# Patient Record
Sex: Female | Born: 1935 | Race: White | Hispanic: No | Smoking: Former smoker
Health system: Southern US, Community
[De-identification: ages and names within clinical notes are randomized; demographics above are authoritative.]

## PROBLEM LIST (undated history)

## (undated) DIAGNOSIS — K209 Esophagitis, unspecified without bleeding: Secondary | ICD-10-CM

## (undated) DIAGNOSIS — I509 Heart failure, unspecified: Secondary | ICD-10-CM

## (undated) DIAGNOSIS — I10 Essential (primary) hypertension: Secondary | ICD-10-CM

## (undated) DIAGNOSIS — S32609A Unspecified fracture of unspecified ischium, initial encounter for closed fracture: Secondary | ICD-10-CM

## (undated) DIAGNOSIS — K222 Esophageal obstruction: Secondary | ICD-10-CM

## (undated) DIAGNOSIS — K449 Diaphragmatic hernia without obstruction or gangrene: Secondary | ICD-10-CM

## (undated) HISTORY — PX: APPENDECTOMY: SHX54

## (undated) HISTORY — DX: Esophagitis, unspecified: K20.9

## (undated) HISTORY — DX: Diaphragmatic hernia without obstruction or gangrene: K44.9

## (undated) HISTORY — DX: Esophagitis, unspecified without bleeding: K20.90

## (undated) HISTORY — DX: Esophageal obstruction: K22.2

---

## 2014-08-27 ENCOUNTER — Emergency Department (HOSPITAL_COMMUNITY): Payer: Medicare Other

## 2014-08-27 ENCOUNTER — Encounter (HOSPITAL_COMMUNITY): Payer: Self-pay | Admitting: Emergency Medicine

## 2014-08-27 ENCOUNTER — Inpatient Hospital Stay (HOSPITAL_COMMUNITY)
Admission: EM | Admit: 2014-08-27 | Discharge: 2014-08-31 | DRG: 871 | Disposition: A | Discharge status: Skilled Nursing Facility | Payer: Medicare Other | Attending: Internal Medicine | Admitting: Internal Medicine

## 2014-08-27 DIAGNOSIS — Z681 Body mass index (BMI) 19 or less, adult: Secondary | ICD-10-CM

## 2014-08-27 DIAGNOSIS — I480 Paroxysmal atrial fibrillation: Secondary | ICD-10-CM | POA: Diagnosis not present

## 2014-08-27 DIAGNOSIS — I2699 Other pulmonary embolism without acute cor pulmonale: Secondary | ICD-10-CM

## 2014-08-27 DIAGNOSIS — R1110 Vomiting, unspecified: Secondary | ICD-10-CM | POA: Diagnosis not present

## 2014-08-27 DIAGNOSIS — L8990 Pressure ulcer of unspecified site, unspecified stage: Secondary | ICD-10-CM | POA: Diagnosis present

## 2014-08-27 DIAGNOSIS — I255 Ischemic cardiomyopathy: Secondary | ICD-10-CM | POA: Diagnosis present

## 2014-08-27 DIAGNOSIS — R570 Cardiogenic shock: Secondary | ICD-10-CM | POA: Insufficient documentation

## 2014-08-27 DIAGNOSIS — I248 Other forms of acute ischemic heart disease: Secondary | ICD-10-CM | POA: Diagnosis not present

## 2014-08-27 DIAGNOSIS — E43 Unspecified severe protein-calorie malnutrition: Secondary | ICD-10-CM | POA: Diagnosis present

## 2014-08-27 DIAGNOSIS — K449 Diaphragmatic hernia without obstruction or gangrene: Secondary | ICD-10-CM | POA: Diagnosis present

## 2014-08-27 DIAGNOSIS — Z87891 Personal history of nicotine dependence: Secondary | ICD-10-CM

## 2014-08-27 DIAGNOSIS — Z515 Encounter for palliative care: Secondary | ICD-10-CM | POA: Diagnosis not present

## 2014-08-27 DIAGNOSIS — I10 Essential (primary) hypertension: Secondary | ICD-10-CM | POA: Diagnosis present

## 2014-08-27 DIAGNOSIS — E2740 Unspecified adrenocortical insufficiency: Secondary | ICD-10-CM | POA: Diagnosis present

## 2014-08-27 DIAGNOSIS — R54 Age-related physical debility: Secondary | ICD-10-CM | POA: Diagnosis present

## 2014-08-27 DIAGNOSIS — S32592D Other specified fracture of left pubis, subsequent encounter for fracture with routine healing: Secondary | ICD-10-CM | POA: Diagnosis not present

## 2014-08-27 DIAGNOSIS — I4891 Unspecified atrial fibrillation: Secondary | ICD-10-CM

## 2014-08-27 DIAGNOSIS — N179 Acute kidney failure, unspecified: Secondary | ICD-10-CM | POA: Diagnosis present

## 2014-08-27 DIAGNOSIS — I5041 Acute combined systolic (congestive) and diastolic (congestive) heart failure: Secondary | ICD-10-CM | POA: Diagnosis not present

## 2014-08-27 DIAGNOSIS — D539 Nutritional anemia, unspecified: Secondary | ICD-10-CM | POA: Diagnosis present

## 2014-08-27 DIAGNOSIS — I429 Cardiomyopathy, unspecified: Secondary | ICD-10-CM

## 2014-08-27 DIAGNOSIS — N281 Cyst of kidney, acquired: Secondary | ICD-10-CM | POA: Diagnosis present

## 2014-08-27 DIAGNOSIS — J9811 Atelectasis: Secondary | ICD-10-CM | POA: Diagnosis present

## 2014-08-27 DIAGNOSIS — R6521 Severe sepsis with septic shock: Secondary | ICD-10-CM | POA: Diagnosis present

## 2014-08-27 DIAGNOSIS — H9190 Unspecified hearing loss, unspecified ear: Secondary | ICD-10-CM | POA: Diagnosis present

## 2014-08-27 DIAGNOSIS — A419 Sepsis, unspecified organism: Principal | ICD-10-CM | POA: Diagnosis not present

## 2014-08-27 DIAGNOSIS — K810 Acute cholecystitis: Secondary | ICD-10-CM | POA: Diagnosis present

## 2014-08-27 DIAGNOSIS — D638 Anemia in other chronic diseases classified elsewhere: Secondary | ICD-10-CM | POA: Diagnosis present

## 2014-08-27 DIAGNOSIS — K829 Disease of gallbladder, unspecified: Secondary | ICD-10-CM

## 2014-08-27 DIAGNOSIS — Z66 Do not resuscitate: Secondary | ICD-10-CM | POA: Diagnosis present

## 2014-08-27 DIAGNOSIS — R579 Shock, unspecified: Secondary | ICD-10-CM | POA: Insufficient documentation

## 2014-08-27 DIAGNOSIS — K819 Cholecystitis, unspecified: Secondary | ICD-10-CM | POA: Diagnosis present

## 2014-08-27 DIAGNOSIS — R1013 Epigastric pain: Secondary | ICD-10-CM

## 2014-08-27 DIAGNOSIS — D72829 Elevated white blood cell count, unspecified: Secondary | ICD-10-CM | POA: Diagnosis present

## 2014-08-27 HISTORY — DX: Essential (primary) hypertension: I10

## 2014-08-27 HISTORY — DX: Unspecified fracture of unspecified ischium, initial encounter for closed fracture: S32.609A

## 2014-08-27 LAB — URINALYSIS, ROUTINE W REFLEX MICROSCOPIC
Bilirubin Urine: NEGATIVE
GLUCOSE, UA: NEGATIVE mg/dL
KETONES UR: NEGATIVE mg/dL
LEUKOCYTES UA: NEGATIVE
Nitrite: NEGATIVE
Protein, ur: >300 mg/dL — AB
Specific Gravity, Urine: 1.015 (ref 1.005–1.030)
Urobilinogen, UA: 0.2 mg/dL (ref 0.0–1.0)
pH: 6 (ref 5.0–8.0)

## 2014-08-27 LAB — CBC WITH DIFFERENTIAL/PLATELET
Basophils Absolute: 0 10*3/uL (ref 0.0–0.1)
Basophils Relative: 0 % (ref 0–1)
EOS PCT: 0 % (ref 0–5)
Eosinophils Absolute: 0 10*3/uL (ref 0.0–0.7)
HEMATOCRIT: 39.3 % (ref 36.0–46.0)
HEMOGLOBIN: 12.6 g/dL (ref 12.0–15.0)
Lymphocytes Relative: 9 % — ABNORMAL LOW (ref 12–46)
Lymphs Abs: 2 10*3/uL (ref 0.7–4.0)
MCH: 31.8 pg (ref 26.0–34.0)
MCHC: 32.1 g/dL (ref 30.0–36.0)
MCV: 99.2 fL (ref 78.0–100.0)
MONO ABS: 2.1 10*3/uL — AB (ref 0.1–1.0)
Monocytes Relative: 9 % (ref 3–12)
Neutro Abs: 19.2 10*3/uL — ABNORMAL HIGH (ref 1.7–7.7)
Neutrophils Relative %: 82 % — ABNORMAL HIGH (ref 43–77)
Platelets: 648 10*3/uL — ABNORMAL HIGH (ref 150–400)
RBC: 3.96 MIL/uL (ref 3.87–5.11)
RDW: 15.5 % (ref 11.5–15.5)
WBC: 23.4 10*3/uL — AB (ref 4.0–10.5)

## 2014-08-27 LAB — COMPREHENSIVE METABOLIC PANEL
ALK PHOS: 138 U/L — AB (ref 39–117)
ALT: 15 U/L (ref 0–35)
AST: 28 U/L (ref 0–37)
Albumin: 2.8 g/dL — ABNORMAL LOW (ref 3.5–5.2)
Anion gap: 12 (ref 5–15)
BILIRUBIN TOTAL: 0.2 mg/dL — AB (ref 0.3–1.2)
BUN: 19 mg/dL (ref 6–23)
CHLORIDE: 104 meq/L (ref 96–112)
CO2: 26 mEq/L (ref 19–32)
CREATININE: 0.72 mg/dL (ref 0.50–1.10)
Calcium: 9.2 mg/dL (ref 8.4–10.5)
GFR calc Af Amer: >90 mL/min (ref >90)
GFR, EST NON AFRICAN AMERICAN: 81 mL/min — AB (ref >90)
Glucose, Bld: 131 mg/dL — ABNORMAL HIGH (ref 70–99)
POTASSIUM: 4.7 meq/L (ref 3.7–5.3)
Sodium: 142 mEq/L (ref 137–147)
Total Protein: 7 g/dL (ref 6.0–8.3)

## 2014-08-27 LAB — LIPASE, BLOOD: LIPASE: 24 U/L (ref 11–59)

## 2014-08-27 LAB — URINE MICROSCOPIC-ADD ON

## 2014-08-27 MED ORDER — CHLORHEXIDINE GLUCONATE 0.12 % MT SOLN
15.0000 mL | Freq: Two times a day (BID) | OROMUCOSAL | Status: DC
  Administered 2014-08-28 – 2014-08-31 (×6): 15 mL via OROMUCOSAL
  Filled 2014-08-27 (×11): qty 15

## 2014-08-27 MED ORDER — SODIUM CHLORIDE 0.9 % IV SOLN
INTRAVENOUS | Status: DC
  Administered 2014-08-27 – 2014-08-29 (×2): via INTRAVENOUS
  Administered 2014-08-29: 500 mL via INTRAVENOUS
  Administered 2014-08-30: 04:00:00 via INTRAVENOUS
  Administered 2014-08-30: 125 mL/h via INTRAVENOUS
  Administered 2014-08-30: 21:00:00 via INTRAVENOUS

## 2014-08-27 MED ORDER — CETYLPYRIDINIUM CHLORIDE 0.05 % MT LIQD
7.0000 mL | Freq: Two times a day (BID) | OROMUCOSAL | Status: DC
  Administered 2014-08-29 – 2014-08-30 (×4): 7 mL via OROMUCOSAL

## 2014-08-27 MED ORDER — MORPHINE SULFATE 2 MG/ML IJ SOLN
1.0000 mg | INTRAMUSCULAR | Status: DC | PRN
  Administered 2014-08-28 – 2014-08-29 (×2): 1 mg via INTRAVENOUS
  Filled 2014-08-27 (×2): qty 1

## 2014-08-27 MED ORDER — SODIUM CHLORIDE 0.9 % IV BOLUS (SEPSIS)
500.0000 mL | Freq: Once | INTRAVENOUS | Status: AC
  Administered 2014-08-27: 500 mL via INTRAVENOUS

## 2014-08-27 MED ORDER — ENOXAPARIN SODIUM 40 MG/0.4ML ~~LOC~~ SOLN
40.0000 mg | SUBCUTANEOUS | Status: DC
  Filled 2014-08-27: qty 0.4

## 2014-08-27 MED ORDER — PIPERACILLIN-TAZOBACTAM 3.375 G IVPB
3.3750 g | Freq: Three times a day (TID) | INTRAVENOUS | Status: DC
  Administered 2014-08-27 – 2014-08-31 (×11): 3.375 g via INTRAVENOUS
  Filled 2014-08-27 (×11): qty 50

## 2014-08-27 MED ORDER — ONDANSETRON HCL 4 MG/2ML IJ SOLN
4.0000 mg | Freq: Four times a day (QID) | INTRAMUSCULAR | Status: DC | PRN
  Administered 2014-08-31: 4 mg via INTRAVENOUS
  Filled 2014-08-27: qty 2

## 2014-08-27 MED ORDER — ONDANSETRON HCL 4 MG PO TABS: 4.0000 mg | ORAL_TABLET | Freq: Four times a day (QID) | ORAL | Status: DC | PRN

## 2014-08-27 MED ORDER — IOHEXOL 300 MG/ML  SOLN
50.0000 mL | Freq: Once | INTRAMUSCULAR | Status: AC | PRN
  Administered 2014-08-27: 50 mL via ORAL

## 2014-08-27 MED ORDER — PIPERACILLIN-TAZOBACTAM 3.375 G IVPB 30 MIN
3.3750 g | Freq: Once | INTRAVENOUS | Status: AC
  Administered 2014-08-27: 3.375 g via INTRAVENOUS
  Filled 2014-08-27: qty 50

## 2014-08-27 MED ORDER — IOHEXOL 300 MG/ML  SOLN
100.0000 mL | Freq: Once | INTRAMUSCULAR | Status: AC | PRN
  Administered 2014-08-27: 100 mL via INTRAVENOUS

## 2014-08-27 MED ORDER — ACETAMINOPHEN 325 MG PO TABS
650.0000 mg | ORAL_TABLET | Freq: Once | ORAL | Status: AC
  Administered 2014-08-27: 650 mg via ORAL
  Filled 2014-08-27: qty 2

## 2014-08-27 MED ORDER — ONDANSETRON HCL 4 MG/2ML IJ SOLN
4.0000 mg | Freq: Once | INTRAMUSCULAR | Status: AC
  Administered 2014-08-27: 4 mg via INTRAVENOUS
  Filled 2014-08-27: qty 2

## 2014-08-27 NOTE — ED Provider Notes (Signed)
CSN: [id number]     Arrival date & time 08/27/14  4132 History   First MD Initiated Contact with Patient 08/27/14 [phone]     Chief Complaint  Patient presents with  . N/V/D      (Consider location/radiation/quality/duration/timing/severity/associated sxs/prior Treatment) HPI Tami Keith is a 79 year old female with past medical history of hypertension who presents to the ER complaining of nausea, vomiting. Patient reports her nausea and vomiting began yesterday right after lunch time. She states she vomited "several times" through the afternoon and then several times in the evening, the last time being approximately 0300 hrs this morning. Patient reports several episodes of diarrhea, which she states were normal for her. Patient reports associated mild, upper abdominal pain. Patient denies fever, dizziness, weakness, blurred vision, syncope, shortness of breath, chest pain, dysuria.  Past Medical History  Diagnosis Date  . Hypertension    History reviewed. No pertinent past surgical history. History reviewed. No pertinent family history. History  Substance Use Topics  . Smoking status: Former Games developer  . Smokeless tobacco: Not on file  . Alcohol Use: No   OB History    No data available     Review of Systems  Constitutional: Negative for fever.  HENT: Negative for trouble swallowing.   Eyes: Negative for visual disturbance.  Respiratory: Negative for shortness of breath.   Cardiovascular: Negative for chest pain.  Gastrointestinal: Positive for nausea, vomiting, abdominal pain and diarrhea.  Genitourinary: Negative for dysuria.  Musculoskeletal: Negative for neck pain.  Skin: Negative for rash.  Neurological: Negative for dizziness, weakness and numbness.  Psychiatric/Behavioral: Negative.       Allergies  Review of patient's allergies indicates no known allergies.  Home Medications   Prior to Admission medications   Medication Sig Start Date End Date Taking?  Authorizing Provider  Amino Acids-Protein Hydrolys (FEEDING SUPPLEMENT, PRO-STAT SUGAR FREE 64,) LIQD Take 30 mLs by mouth 2 (two) times daily.   Yes Historical Provider, MD  citalopram (CELEXA) 20 MG tablet Take 20 mg by mouth daily.   Yes Historical Provider, MD  HYDROcodone-acetaminophen (NORCO/VICODIN) 5-325 MG per tablet Take 1 tablet by mouth every 6 (six) hours as needed for moderate pain.   Yes Historical Provider, MD  lactose free nutrition (BOOST) LIQD Take 237 mLs by mouth 2 (two) times daily between meals.   Yes Historical Provider, MD  loperamide (IMODIUM) 2 MG capsule Take 2 mg by mouth daily as needed for diarrhea or loose stools.   Yes Historical Provider, MD  loratadine (CLARITIN) 10 MG tablet Take 10 mg by mouth daily as needed for allergies.   Yes Historical Provider, MD  metroNIDAZOLE (FLAGYL) 500 MG tablet Take 500 mg by mouth 2 (two) times daily.   Yes Historical Provider, MD  potassium phosphate, monobasic, (K-PHOS ORIGINAL) 500 MG tablet Take 500 mg by mouth 2 (two) times daily with a meal.   Yes Historical Provider, MD  sennosides-docusate sodium (SENOKOT-S) 8.6-50 MG tablet Take 1 tablet by mouth daily.   Yes Historical Provider, MD  sodium bicarbonate 650 MG tablet Take 650 mg by mouth daily.   Yes Historical Provider, MD   BP 117/85 mmHg  Pulse 103  Temp(Src) 97.8 F (36.6 C) (Oral)  Resp 19  SpO2 93% Physical Exam  Constitutional: Vital signs are normal.  Non-toxic appearance.  Frail-appearing elderly female sitting semi-Fowlers in the stretcher, speaking in full, clear sentences in no acute distress.  HENT:  Head: Normocephalic and atraumatic.  Mouth/Throat: Oropharynx is clear and  moist. No oropharyngeal exudate.  Eyes: Right eye exhibits no discharge. Left eye exhibits no discharge. No scleral icterus.  Neck: Normal range of motion.  Cardiovascular: Normal rate, regular rhythm, S1 normal, S2 normal, normal heart sounds and normal pulses.   No murmur  heard. Pulmonary/Chest: Effort normal and breath sounds normal. No accessory muscle usage. No tachypnea. No respiratory distress.  Abdominal: Soft. Normal appearance and bowel sounds are normal. There is tenderness in the epigastric area. There is no rigidity, no guarding, no tenderness at McBurney's point and negative Murphy's sign.  Mild epigastric tenderness.  Musculoskeletal: Normal range of motion. She exhibits no edema or tenderness.  Neurological: She is alert. She has normal strength. No cranial nerve deficit or sensory deficit. Coordination normal. GCS eye subscore is 4. GCS verbal subscore is 5. GCS motor subscore is 6.  Patient fully alert answering questions appropriately in full, clear sentences. Cranial nerves II through XII grossly intact.  Skin: Skin is warm and dry. No rash noted.  Psychiatric: She has a normal mood and affect.  Nursing note and vitals reviewed.   ED Course  Procedures (including critical care time) Labs Review Labs Reviewed  CBC WITH DIFFERENTIAL - Abnormal; Notable for the following:    WBC 23.4 (*)    Platelets 648 (*)    Neutrophils Relative % 82 (*)    Neutro Abs 19.2 (*)    Lymphocytes Relative 9 (*)    Monocytes Absolute 2.1 (*)    All other components within normal limits  URINALYSIS, ROUTINE W REFLEX MICROSCOPIC - Abnormal; Notable for the following:    Hgb urine dipstick SMALL (*)    Protein, ur >300 (*)    All other components within normal limits  COMPREHENSIVE METABOLIC PANEL - Abnormal; Notable for the following:    Glucose, Bld 131 (*)    Albumin 2.8 (*)    Alkaline Phosphatase 138 (*)    Total Bilirubin 0.2 (*)    GFR calc non Af Amer 81 (*)    All other components within normal limits  URINE MICROSCOPIC-ADD ON - Abnormal; Notable for the following:    Squamous Epithelial / LPF FEW (*)    All other components within normal limits  LIPASE, BLOOD    Imaging Review Ct Abdomen Pelvis W Contrast  08/27/2014   CLINICAL DATA:   Nursing Home patient with acute epigastric pain, nausea, vomiting, diarrhea.  EXAM: CT ABDOMEN AND PELVIS WITH CONTRAST  TECHNIQUE: Multidetector CT imaging of the abdomen and pelvis was performed using the standard protocol following bolus administration of intravenous contrast.  CONTRAST:  50mL OMNIPAQUE IOHEXOL 300 MG/ML SOLN, 100mL OMNIPAQUE IOHEXOL 300 MG/ML SOLN  COMPARISON:  08/27/2014 abdominal series  FINDINGS: Lower chest: Minor basilar interstitial fibrosis pattern with atelectasis and trace bilateral pleural effusions. Large hiatal hernia noted. Normal heart size. No pericardial effusion.  Abdomen: Abdominal anatomy is slightly distorted secondary to the degree of chronic levoscoliosis of the spine.  Gallbladder is in the midline of the abdomen with pericholecystic fluid noted and mucosal enhancement. Small amount of free fluid tracks along the right inferior liver margin and the right pericolic gutter. Appearance is compatible with cholecystitis. No associated biliary dilatation. Liver, biliary system, pancreas, spleen, adrenal glands, and left kidney are within normal limits for age and demonstrate no acute process.  Right kidney demonstrates a large upper pole renal cyst measuring 8.2 x 8.6 cm, image 14.  Atherosclerosis noted of the abdominal aorta without aneurysm. Aorta is tortuous again related  to the scoliosis.  Negative for bowel obstruction, dilatation, ileus, or free air.  Appendix not demonstrated.  Pelvis: Trace pelvic free fluid. Prior hysterectomy noted. No acute distal bowel process. Urinary bladder unremarkable. No pelvic fluid collection, hemorrhage, abscess, adenopathy, inguinal abnormality, or hernia.  Healing fractures present of the left superior and inferior rami. Degenerative changes of the hips and spine diffusely.  Imaged proximal right hip and thigh demonstrates subcutaneous emphysema, of uncertain origin. This is partially imaged.  IMPRESSION: Abnormal gallbladder with mucosal  enhancement and surrounding pericholecystic fluid. Free fluid tracks along the posterior right liver margin and right pericolic gutter. Findings are compatible with cholecystitis.  No associated biliary dilatation or obstruction.  Small bilateral pleural effusions with basilar atelectasis/fibrosis  Large hiatal hernia  8.6 cm right upper pole renal cyst  Anterior right hip and proximal thigh subcutaneous air/ emphysema without clear etiology.  Healing subacute left superior and inferior rami fractures.  These results will be called to the ordering clinician or representative by the Radiology Department at the imaging location.   Electronically Signed   By: Ruel Favorsrevor  Shick M.D.   On: 08/27/2014 13:32   Dg Abd Acute W/chest  08/27/2014   CLINICAL DATA:  Nausea, vomiting, and diarrhea for 2 days.  EXAM: ACUTE ABDOMEN SERIES (ABDOMEN 2 VIEW & CHEST 1 VIEW)  COMPARISON:  Two-view chest x-ray 08/03/2014  FINDINGS: A large hiatal hernia is again seen. The heart size is normal. Emphysematous changes are noted in the lungs.  Supine in decubitus imaging of the abdomen demonstrates a relatively gasless abdomen. There is no evidence for obstruction or free air. Levoconvex scoliosis of the lumbar spine is again seen. Atherosclerotic changes are noted in the aorta.  IMPRESSION: 1. No acute abnormality of the chest or abdomen. 2. Gasless abdomen without evidence for obstruction or free air. 3. Atherosclerosis. 4. Large hiatal hernia.   Electronically Signed   By: Gennette Pachris  Mattern M.D.   On: 08/27/2014 07:23     EKG Interpretation None      MDM   Final diagnoses:  Vomiting  Epigastric pain    79 year old female presenting with one day of nausea and vomiting. Patient afebrile, nontachypneic, non-tachycardic, ill appearing, however in no acute distress. Workup for possible gastroenteritis or acute abdominal pain and symptomatic therapy.   Acute abdomen with chest x-ray with impression: 1. No acute abnormality of the  chest or abdomen. 2. Gasless abdomen without evidence for obstruction or free air. 3. Atherosclerosis. 4. Large hiatal hernia.  Pt with leukocytosis of 23.  Patient continues to be tachycardic around 115 on reexam.Will f/u with CT abd pelv with contrast.    CT abdomen pelvis with impression: Abnormal gallbladder with mucosal enhancement and surrounding pericholecystic fluid. Free fluid tracks along the posterior right liver margin and right pericolic gutter. Findings are compatible with cholecystitis.  No associated biliary dilatation or obstruction.  Small bilateral pleural effusions with basilar atelectasis/fibrosis  Large hiatal hernia  8.6 cm right upper pole renal cyst  Anterior right hip and proximal thigh subcutaneous air/ emphysema without clear etiology.  Healing subacute left superior and inferior rami fractures.  Consult placed to surgery. Surgery advises to admit patient to medicine. [facility].  The patient appears reasonably stabilized for admission considering the current resources, flow, and capabilities available in the ED at this time, and I doubt any other [facility] requiring further screening and/or treatment in the ED prior to admission.  BP 117/85 mmHg  Pulse  103  Temp(Src) 97.8 F (36.6 C) (Oral)  Resp 19  SpO2 93%  Signed,  Ladona Mow, PA-C 4:37 PM  Patient seen and discussed with Dr. Benjiman Core, M.D.   Monte Fantasia, PA-C 08/27/14 1637  Shon Baton, MD 08/29/14 [phone]

## 2014-08-27 NOTE — Plan of Care (Signed)
Problem: Phase I Progression Outcomes Goal: Pain controlled with appropriate interventions Outcome: Completed/Met Date Met:  08/27/14 Goal: Hemodynamically stable Outcome: Completed/Met Date Met:  08/27/14

## 2014-08-27 NOTE — ED Notes (Signed)
Pt comes [facility] states she has had n/v/d x2 days. Facility has given pt phenergan 12.5mg  and immodium, 600ml NS bolus with no relief. EMS gave pt 4mg  of Zofran with relief. Pt c/o feeling cold. Pt is alert and oriented.

## 2014-08-27 NOTE — ED Notes (Signed)
Bed: WA06 Expected date:  Expected time:  Means of arrival:  Comments: Ems- 

## 2014-08-27 NOTE — Progress Notes (Signed)
ANTIBIOTIC CONSULT NOTE - INITIAL  Pharmacy Consult for Zosyn Indication: Intra-abdominal infection/Cholecystitis  No Known Allergies  Patient Measurements: Height: 5\' 7"  (170.2 cm) Weight: 80 lb (36.288 kg) IBW/kg (Calculated) : 61.6   Vital Signs: Temp: 97.7 F (36.5 C) (11/14 1710) Temp Source: Oral (11/14 1710) BP: 102/75 mmHg (11/14 1710) Pulse Rate: 100 (11/14 1710) Intake/Output from previous day:   Intake/Output from this shift:    Labs:  Recent Labs  08/27/14 0719 08/27/14 0927  WBC 23.4*  --   HGB 12.6  --   PLT 648*  --   CREATININE  --  0.72   Estimated Creatinine Clearance: 33.7 mL/min (by C-G formula based on Cr of 0.72). No results for input(s): VANCOTROUGH, VANCOPEAK, VANCORANDOM, GENTTROUGH, GENTPEAK, GENTRANDOM, TOBRATROUGH, TOBRAPEAK, TOBRARND, AMIKACINPEAK, AMIKACINTROU, AMIKACIN in the last 72 hours.   Microbiology: No results found for this or any previous visit (from the past 720 hour(s)).  Medical History: Past Medical History  Diagnosis Date  . Hypertension     Assessment: 79 y/o F with PMH of HTN presenting with persistent nausea, vomiting, abdominal pain, and diarrhea for more than a week. CT of abdomen done in ED revealed findings consistent with cholecystitis. Pharmacy has been consulted to assist with Zosyn dosing.  11/14 >> Zosyn  >>  Tmax: 98.8F WBCs: elevated, 23.4 Renal: WNL, SCr 0.72  Goal of Therapy:  Appropriate antibiotic dosing for renal function Eradication of infection   Plan:  1. Zosyn 3.375 grams IV x 1 over 30 minutes (already ordered), then continue with 3.375 grams IV q8h with each dose infused over 4 hours. 2. Follow renal function, cultures, clinical progress.   Greer PickerelJigna Dwayna Kentner, PharmD, BCPS Pager: [phone] 08/27/2014 5:33 PM

## 2014-08-27 NOTE — ED Notes (Addendum)
Pt nauseous with CT contrast. Unable to hold contrast down. Pt states it tastes nasty.

## 2014-08-27 NOTE — ED Notes (Addendum)
Attempt to collect urine sample. Pt brief saturated with urine. Pt assisted with bedpan. Pt unable to provide sample. New brief applied and pericare done.  PA notified.

## 2014-08-27 NOTE — ED Notes (Signed)
Pt IV placed by facility in thigh facing opposite direction. IV removed and new IV placed in right wrist.

## 2014-08-27 NOTE — ED Notes (Signed)
In and Out was performed. No urine return. Nurse was notified.

## 2014-08-27 NOTE — Consult Note (Signed)
Re:   Tami Keith DOB:   05-19-1935 MRN:   [id number]   [facility] Consultation  ASSESSMENT AND PLAN: 1.  Cholecystitis by CT scan - the patient is fairly asymptomatic on exam  Her family is at the bedside.  I discussed with the patient the indications and risks of gall bladder surgery.  The primary risks of gall bladder surgery include, but are not limited to, bleeding, infection, common bile duct injury, and open surgery.   I tried to answer the patient's questions.  I gave the patient a booklet about gall bladder surgery.  Because of her frailty, she is at high risk for post op complications.  I outlined the options of: 1) surgery for gall bladder disease (one daughter has had gall bladder surgery), 2) percutaneous drain, 3) antibiotics and see how she does.  At this time, she does not want surgery.  She does not look sick.  Not sure that the leukocytosis is explained entirely by the gall bladder.  Will repeat CBC in AM and get US of gall bladder to better show gall bladder anatomy.  On Zosyn.  Will follow.  2.  Frail elderly patient with no muscle mass 3.  Recent pelvic fracture - left pubic rami fx on CT scan  Walks only with assistance 4.  HTN 5.  Significant hiatal hernia on CT scan 6.  Right renal cyst  Chief Complaint  Patient presents with  . N/V/D    REFERRING PHYSICIAN:  Jinny SandersJoseph Mintz, PA, [facility]  HISTORY OF PRESENT ILLNESS: Tami GhaziVera Theurer is a 79 y.o. (DOB: 05-19-1935)  white  female whose primary care physician is No primary care provider on file. ([facility]. She has her 3 daughters in the room:  Leslee HomeBonnie Baker, Trula Orehristina Spainhour, and General Motorsachel Dinkins.  She fell at home about one month ago.  She fractured her pelvis and [facility].  She has run some fevers whose etiology is unclear.  Most recently she was on Flagyl, but was having some diarrhea.  She said that she has "hurt  all over", but no localized pain.  She had an appendectomy at age 89 and has had a hysterectomy.  She has no known GI disease and she said that she had a colonoscopy just a couple of years ago.  CT scan of abdomen - 08/27/2014 - 1.  Abnormal gallbladder with mucosal enhancement and surrounding pericholecystic fluid. Free fluid tracks along the posterior right liver margin and right pericolic gutter. Findings are compatible with cholecystitis, 2.  Large hiatal hernia,  3. 8.6 cm right upper pole renal cyst, 4.  Anterior right hip and proximal thigh subcutaneous air/ emphysema without clear etiology.  5)  Healing subacute left superior and inferior rami fractures.  WBC - 23,400 - 08/27/2014  Past Medical History  Diagnosis Date  . Hypertension      History reviewed. No pertinent past surgical history.    Current Facility-Administered Medications  Medication Dose Route Frequency Provider Last Rate Last Dose  . piperacillin-tazobactam (ZOSYN) IVPB 3.375 g  3.375 g Intravenous Once Kathlen ModyVijaya Akula, MD      . Melene Muller[START ON 08/28/2014] piperacillin-tazobactam (ZOSYN) IVPB 3.375 g  3.375 g Intravenous Q8H Kathlen ModyVijaya Akula, MD       Current Outpatient Prescriptions  Medication Sig Dispense Refill  . Amino Acids-Protein Hydrolys (FEEDING SUPPLEMENT, PRO-STAT SUGAR FREE 64,) LIQD Take 30 mLs by mouth  2 (two) times daily.    . citalopram (CELEXA) 20 MG tablet Take 20 mg by mouth daily.    Marland Kitchen. HYDROcodone-acetaminophen (NORCO/VICODIN) 5-325 MG per tablet Take 1 tablet by mouth every 6 (six) hours as needed for moderate pain.    Marland Kitchen. lactose free nutrition (BOOST) LIQD Take 237 mLs by mouth 2 (two) times daily between meals.    Marland Kitchen. loperamide (IMODIUM) 2 MG capsule Take 2 mg by mouth daily as needed for diarrhea or loose stools.    Marland Kitchen. loratadine (CLARITIN) 10 MG tablet Take 10 mg by mouth daily as needed for allergies.    . metroNIDAZOLE (FLAGYL) 500 MG tablet Take 500 mg by mouth 2 (two) times daily.    . potassium  phosphate, monobasic, (K-PHOS ORIGINAL) 500 MG tablet Take 500 mg by mouth 2 (two) times daily with a meal.    . sennosides-docusate sodium (SENOKOT-S) 8.6-50 MG tablet Take 1 tablet by mouth daily.    . sodium bicarbonate 650 MG tablet Take 650 mg by mouth daily.       No Known Allergies  REVIEW OF SYSTEMS: Skin:  No history of rash.   Infection:  No history of MRSA. Neurologic:  No history of stroke.  No history of seizure.  No history of headaches. Cardiac:  Hypertension for about 2 years.  Though recently, it sounds like she has held her meds because her BP has been too low. Pulmonary:  Quit smoking many years ago.  No asthma or bronchitis.  No OSA/CPAP.  Endocrine:  No diabetes. No thyroid disease. Gastrointestinal:  See HPI.  No stomach, liver, or colon history. Urologic:  No history of kidney stones.  No history of bladder infections. Musculoskeletal:  In [facility] because of broken pelvis.  No surgery. Hematologic:  No bleeding disorder.  No history of anemia.  Not anticoagulated. Psycho-social:  The patient is oriented.   The patient has no obvious psychologic or social impairment to understanding our conversation and plan.  SOCIAL and FAMILY HISTORY: Widowed in 2004. She has her 3 daughters in the room:  Leslee HomeBonnie Baker, Trula Orehristina Spainhour, and General Motorsachel Dinkins. [facility] for about a month.  It does not appear that she can go back to independent living.  PHYSICAL EXAM: BP 121/90 mmHg  Pulse 103  Temp(Src) 97.9 F (36.6 C) (Oral)  Resp 25  SpO2 93%  General: Thin, cachectic WF who is alert.  HEENT: Normal. Pupils equal. Neck: Supple. No mass.  No thyroid mass. Lymph Nodes:  No supraclavicular or cervical nodes. Lungs: Clear to auscultation and symmetric breath sounds. Heart:  RRR. No murmur or rub. Abdomen: Soft. No mass.  No hernia. Normal bowel sounds.  She has no localized tenderness and I cannot feel the gall bladder.  She is  tiny. Rectal: Not done. Extremities:  I can feel crepitus over her left upper/lateral thigh. Neurologic:  Grossly intact to motor and sensory function. Psychiatric: Behavior is normal.   DATA REVIEWED: Epic notes.  Ovidio Kinavid Sacha Topor, MD,  [facility] [address]  [address]   [place], [place]    [id number] Phone:  [phone] FAX:  [phone]

## 2014-08-27 NOTE — H&P (Signed)
[facility] History and Physical  Akaysha Cobern ZOX:[id number] DOB: [birth year] DOA: 08/27/2014  Referring physician: EDP PCP: No primary care provider on file.   Chief Complaint: nausea, vomiting and diarrhea for more than a week.   HPI: Tami Keith is a 79 y.o. female  With prior h/o hypertension, coming from [facility], presents with persistent nausea, vomiting and abdominal pain and diarrhea for more than week. On arrival to ED, CT abdomen revealed findings consistent with cholecystitis. She denies fever, but reports chills and generalized weakness. Labs revealed elevated wbc count and thrombocytosis. She is referred to medical service for admission and surgery Dr Ezzard Standing consulted.    Review of Systems:  Constitutional:  Chills present.  HEENT:  No headaches, Difficulty swallowing,Tooth/dental problems,Sore throat,  No sneezing, itching, ear ache, nasal congestion, post nasal drip,  Cardio-vascular:  No chest pain, Orthopnea, PND, swelling in lower extremities, anasarca, dizziness, palpitations  GI:  Nausea, vomiting and diarrhea for more than a week.  Resp:  No shortness of breath with exertion or at rest. No excess mucus, no productive cough, No non-productive cough, No coughing up of blood.No change in color of mucus.No wheezing.No chest wall deformity  Skin:  no rash or lesions.  GU:  no dysuria, change in color of urine, no urgency or frequency. No flank pain.  Musculoskeletal:  No joint pain or swelling. No decreased range of motion. No back pain.    Past Medical History  Diagnosis Date  . Hypertension    History reviewed. No pertinent past surgical history. Social History:  reports that she has quit smoking. She does not have any smokeless tobacco history on file. She reports that she does not drink alcohol or use illicit drugs.  No Known Allergies  History reviewed. No pertinent family history known.  Prior to Admission medications   Medication Sig Start  Date End Date Taking? Authorizing Provider  Amino Acids-Protein Hydrolys (FEEDING SUPPLEMENT, PRO-STAT SUGAR FREE 64,) LIQD Take 30 mLs by mouth 2 (two) times daily.   Yes Historical Provider, MD  citalopram (CELEXA) 20 MG tablet Take 20 mg by mouth daily.   Yes Historical Provider, MD  HYDROcodone-acetaminophen (NORCO/VICODIN) 5-325 MG per tablet Take 1 tablet by mouth every 6 (six) hours as needed for moderate pain.   Yes Historical Provider, MD  lactose free nutrition (BOOST) LIQD Take 237 mLs by mouth 2 (two) times daily between meals.   Yes Historical Provider, MD  loperamide (IMODIUM) 2 MG capsule Take 2 mg by mouth daily as needed for diarrhea or loose stools.   Yes Historical Provider, MD  loratadine (CLARITIN) 10 MG tablet Take 10 mg by mouth daily as needed for allergies.   Yes Historical Provider, MD  metroNIDAZOLE (FLAGYL) 500 MG tablet Take 500 mg by mouth 2 (two) times daily.   Yes Historical Provider, MD  potassium phosphate, monobasic, (K-PHOS ORIGINAL) 500 MG tablet Take 500 mg by mouth 2 (two) times daily with a meal.   Yes Historical Provider, MD  sennosides-docusate sodium (SENOKOT-S) 8.6-50 MG tablet Take 1 tablet by mouth daily.   Yes Historical Provider, MD  sodium bicarbonate 650 MG tablet Take 650 mg by mouth daily.   Yes Historical Provider, MD   Physical Exam: Filed Vitals:   08/27/14 1146 08/27/14 1230 08/27/14 1300 08/27/14 1536  BP: 142/95 138/91 141/99 117/85  Pulse: 102 102 111 103  Temp: 97.8 F (36.6 C)     TempSrc: Oral     Resp: 20 13 17  19  SpO2: 95% 97% 95% 93%    Wt Readings from Last 3 Encounters:  No data found for Wt    General:  Appears calm and comfortable Eyes: PERRL, normal lids, irises & conjunctiva Neck: no LAD, masses or thyromegaly Cardiovascular: RRR, no m/r/g. No LE edema. Respiratory: CTA bilaterally, no w/r/r. Normal respiratory effort. Abdomen: soft, mild generalized tenderness, bowel sounds heard.  Skin: stage 1 decubitus over  the sacrum.  Musculoskeletal: grossly normal tone BUE/BLE Neurologic: alert, oriented to place and person, speech normal and able to move all extremities. .          Labs on Admission:  Basic Metabolic Panel:  Recent Labs Lab 08/27/14 0927  NA 142  K 4.7  CL 104  CO2 26  GLUCOSE 131*  BUN 19  CREATININE 0.72  CALCIUM 9.2   Liver Function Tests:  Recent Labs Lab 08/27/14 0927  AST 28  ALT 15  ALKPHOS 138*  BILITOT 0.2*  PROT 7.0  ALBUMIN 2.8*    Recent Labs Lab 08/27/14 0927  LIPASE 24   No results for input(s): AMMONIA in the last 168 hours. CBC:  Recent Labs Lab 08/27/14 0719  WBC 23.4*  NEUTROABS 19.2*  HGB 12.6  HCT 39.3  MCV 99.2  PLT 648*   Cardiac Enzymes: No results for input(s): CKTOTAL, CKMB, CKMBINDEX, TROPONINI in the last 168 hours.  BNP (last 3 results) No results for input(s): PROBNP in the last 8760 hours. CBG: No results for input(s): GLUCAP in the last 168 hours.  Radiological Exams on Admission: Ct Abdomen Pelvis W Contrast  08/27/2014   CLINICAL DATA:  Nursing Home patient with acute epigastric pain, nausea, vomiting, diarrhea.  EXAM: CT ABDOMEN AND PELVIS WITH CONTRAST  TECHNIQUE: Multidetector CT imaging of the abdomen and pelvis was performed using the standard protocol following bolus administration of intravenous contrast.  CONTRAST:  50mL OMNIPAQUE IOHEXOL 300 MG/ML SOLN, 100mL OMNIPAQUE IOHEXOL 300 MG/ML SOLN  COMPARISON:  08/27/2014 abdominal series  FINDINGS: Lower chest: Minor basilar interstitial fibrosis pattern with atelectasis and trace bilateral pleural effusions. Large hiatal hernia noted. Normal heart size. No pericardial effusion.  Abdomen: Abdominal anatomy is slightly distorted secondary to the degree of chronic levoscoliosis of the spine.  Gallbladder is in the midline of the abdomen with pericholecystic fluid noted and mucosal enhancement. Small amount of free fluid tracks along the right inferior liver margin and  the right pericolic gutter. Appearance is compatible with cholecystitis. No associated biliary dilatation. Liver, biliary system, pancreas, spleen, adrenal glands, and left kidney are within normal limits for age and demonstrate no acute process.  Right kidney demonstrates a large upper pole renal cyst measuring 8.2 x 8.6 cm, image 14.  Atherosclerosis noted of the abdominal aorta without aneurysm. Aorta is tortuous again related to the scoliosis.  Negative for bowel obstruction, dilatation, ileus, or free air.  Appendix not demonstrated.  Pelvis: Trace pelvic free fluid. Prior hysterectomy noted. No acute distal bowel process. Urinary bladder unremarkable. No pelvic fluid collection, hemorrhage, abscess, adenopathy, inguinal abnormality, or hernia.  Healing fractures present of the left superior and inferior rami. Degenerative changes of the hips and spine diffusely.  Imaged proximal right hip and thigh demonstrates subcutaneous emphysema, of uncertain origin. This is partially imaged.  IMPRESSION: Abnormal gallbladder with mucosal enhancement and surrounding pericholecystic fluid. Free fluid tracks along the posterior right liver margin and right pericolic gutter. Findings are compatible with cholecystitis.  No associated biliary dilatation or obstruction.  Small bilateral pleural effusions  with basilar atelectasis/fibrosis  Large hiatal hernia  8.6 cm right upper pole renal cyst  Anterior right hip and proximal thigh subcutaneous air/ emphysema without clear etiology.  Healing subacute left superior and inferior rami fractures.  These results will be called to the ordering clinician or representative by the Radiology Department at the imaging location.   Electronically Signed   By: Ruel Favorsrevor  Shick M.D.   On: 08/27/2014 13:32   Dg Abd Acute W/chest  08/27/2014   CLINICAL DATA:  Nausea, vomiting, and diarrhea for 2 days.  EXAM: ACUTE ABDOMEN SERIES (ABDOMEN 2 VIEW & CHEST 1 VIEW)  COMPARISON:  Two-view chest  x-ray 08/03/2014  FINDINGS: A large hiatal hernia is again seen. The heart size is normal. Emphysematous changes are noted in the lungs.  Supine in decubitus imaging of the abdomen demonstrates a relatively gasless abdomen. There is no evidence for obstruction or free air. Levoconvex scoliosis of the lumbar spine is again seen. Atherosclerotic changes are noted in the aorta.  IMPRESSION: 1. No acute abnormality of the chest or abdomen. 2. Gasless abdomen without evidence for obstruction or free air. 3. Atherosclerosis. 4. Large hiatal hernia.   Electronically Signed   By: Gennette Pachris  Mattern M.D.   On: 08/27/2014 07:23    EKG: pending.   Assessment/Plan Active Problems:   Cholecystitis   Acute cholecystitis  Nausea, vomiting Diarrhea and abdominal pain probably from Acute cholecystitis: Admitted to med surg and started her on IV antibiotics and IV fluids.  Surgery consulted for recommendations.  IV anti emetics and NPO .    Leukocytosis probably from the cholecystitis.    DVT prophylaxis.     Code Status: DNR DVT Prophylaxis: Family Communication: discussed with family at bedside Disposition Plan: admit to med surg.   Time spent: 55 min.  [facility] [place] LLCKULA,Thelmer Legler [facility] Pager [phone]

## 2014-08-28 ENCOUNTER — Inpatient Hospital Stay (HOSPITAL_COMMUNITY): Payer: Medicare Other

## 2014-08-28 DIAGNOSIS — R6521 Severe sepsis with septic shock: Secondary | ICD-10-CM

## 2014-08-28 DIAGNOSIS — I4891 Unspecified atrial fibrillation: Secondary | ICD-10-CM | POA: Diagnosis not present

## 2014-08-28 DIAGNOSIS — A419 Sepsis, unspecified organism: Secondary | ICD-10-CM | POA: Diagnosis not present

## 2014-08-28 LAB — PHOSPHORUS: Phosphorus: 2.9 mg/dL (ref 2.3–4.6)

## 2014-08-28 LAB — BASIC METABOLIC PANEL
ANION GAP: 12 (ref 5–15)
BUN: 24 mg/dL — ABNORMAL HIGH (ref 6–23)
CALCIUM: 8.4 mg/dL (ref 8.4–10.5)
CO2: 23 mEq/L (ref 19–32)
Chloride: 106 mEq/L (ref 96–112)
Creatinine, Ser: 1.29 mg/dL — ABNORMAL HIGH (ref 0.50–1.10)
GFR, EST AFRICAN AMERICAN: 45 mL/min — AB (ref >90)
GFR, EST NON AFRICAN AMERICAN: 39 mL/min — AB (ref >90)
Glucose, Bld: 98 mg/dL (ref 70–99)
Potassium: 4.5 mEq/L (ref 3.7–5.3)
SODIUM: 141 meq/L (ref 137–147)

## 2014-08-28 LAB — CBC WITH DIFFERENTIAL/PLATELET
Basophils Absolute: 0 10*3/uL (ref 0.0–0.1)
Basophils Relative: 0 % (ref 0–1)
Eosinophils Absolute: 0 10*3/uL (ref 0.0–0.7)
Eosinophils Relative: 0 % (ref 0–5)
HCT: 33.7 % — ABNORMAL LOW (ref 36.0–46.0)
HEMOGLOBIN: 10.5 g/dL — AB (ref 12.0–15.0)
LYMPHS PCT: 12 % (ref 12–46)
Lymphs Abs: 2.4 10*3/uL (ref 0.7–4.0)
MCH: 31.3 pg (ref 26.0–34.0)
MCHC: 31.2 g/dL (ref 30.0–36.0)
MCV: 100.6 fL — ABNORMAL HIGH (ref 78.0–100.0)
MONOS PCT: 13 % — AB (ref 3–12)
Monocytes Absolute: 2.6 10*3/uL — ABNORMAL HIGH (ref 0.1–1.0)
NEUTROS ABS: 15.3 10*3/uL — AB (ref 1.7–7.7)
NEUTROS PCT: 75 % (ref 43–77)
Platelets: 508 10*3/uL — ABNORMAL HIGH (ref 150–400)
RBC: 3.35 MIL/uL — ABNORMAL LOW (ref 3.87–5.11)
RDW: 15.8 % — ABNORMAL HIGH (ref 11.5–15.5)
WBC: 20.3 10*3/uL — AB (ref 4.0–10.5)

## 2014-08-28 LAB — MRSA PCR SCREENING: MRSA BY PCR: NEGATIVE

## 2014-08-28 LAB — PROTIME-INR
INR: 1.68 — AB (ref 0.00–1.49)
Prothrombin Time: 19.9 seconds — ABNORMAL HIGH (ref 11.6–15.2)

## 2014-08-28 LAB — LACTIC ACID, PLASMA: Lactic Acid, Venous: 2 mmol/L (ref 0.5–2.2)

## 2014-08-28 LAB — APTT: aPTT: 32 seconds (ref 24–37)

## 2014-08-28 LAB — CLOSTRIDIUM DIFFICILE BY PCR: CDIFFPCR: NEGATIVE

## 2014-08-28 LAB — MAGNESIUM: MAGNESIUM: 1.8 mg/dL (ref 1.5–2.5)

## 2014-08-28 LAB — D-DIMER, QUANTITATIVE: D-Dimer, Quant: 1.57 ug/mL-FEU — ABNORMAL HIGH (ref 0.00–0.48)

## 2014-08-28 MED ORDER — SODIUM CHLORIDE 0.9 % IV BOLUS (SEPSIS)
500.0000 mL | Freq: Once | INTRAVENOUS | Status: AC
  Administered 2014-08-28: 500 mL via INTRAVENOUS

## 2014-08-28 MED ORDER — HEPARIN BOLUS VIA INFUSION
900.0000 [IU] | Freq: Once | INTRAVENOUS | Status: AC
  Administered 2014-08-28: 900 [IU] via INTRAVENOUS
  Filled 2014-08-28: qty 900

## 2014-08-28 MED ORDER — METRONIDAZOLE IN NACL 5-0.79 MG/ML-% IV SOLN
500.0000 mg | Freq: Once | INTRAVENOUS | Status: AC
  Administered 2014-08-28: 500 mg via INTRAVENOUS
  Filled 2014-08-28: qty 100

## 2014-08-28 MED ORDER — AMIODARONE LOAD VIA INFUSION
150.0000 mg | Freq: Once | INTRAVENOUS | Status: AC
  Administered 2014-08-28: 150 mg via INTRAVENOUS
  Filled 2014-08-28: qty 83.34

## 2014-08-28 MED ORDER — SODIUM CHLORIDE 0.9 % IV BOLUS (SEPSIS): 1000.0000 mL | Freq: Once | INTRAVENOUS | Status: DC

## 2014-08-28 MED ORDER — PHENYLEPHRINE HCL 10 MG/ML IJ SOLN
0.0000 ug/min | INTRAVENOUS | Status: DC
  Administered 2014-08-28: 10 ug/min via INTRAVENOUS
  Administered 2014-08-28: 50 ug/min via INTRAVENOUS
  Administered 2014-08-29: 75 ug/min via INTRAVENOUS
  Filled 2014-08-28 (×3): qty 1

## 2014-08-28 MED ORDER — HEPARIN (PORCINE) IN NACL 100-0.45 UNIT/ML-% IJ SOLN
500.0000 [IU]/h | INTRAMUSCULAR | Status: DC
  Administered 2014-08-28: 500 [IU]/h via INTRAVENOUS
  Filled 2014-08-28: qty 250

## 2014-08-28 MED ORDER — SODIUM CHLORIDE 0.9 % IV BOLUS (SEPSIS)
1000.0000 mL | Freq: Once | INTRAVENOUS | Status: AC
  Administered 2014-08-28: 1000 mL via INTRAVENOUS

## 2014-08-28 MED ORDER — AMIODARONE HCL IN DEXTROSE 360-4.14 MG/200ML-% IV SOLN
60.0000 mg/h | INTRAVENOUS | Status: AC
  Administered 2014-08-28 (×2): 60 mg/h via INTRAVENOUS
  Filled 2014-08-28 (×2): qty 200

## 2014-08-28 MED ORDER — AMIODARONE HCL IN DEXTROSE 360-4.14 MG/200ML-% IV SOLN
30.0000 mg/h | INTRAVENOUS | Status: DC
  Administered 2014-08-28: 30 mg/h via INTRAVENOUS
  Filled 2014-08-28: qty 200

## 2014-08-28 NOTE — Progress Notes (Signed)
Upon arrival to unit, patient in A.Fib, runs of vtach and heart rate fluctuating between 120's-180's. BP 69/47 MAP 53. Patient alert and oriented x3; cannot tell me where we were. Answers questions appropriately. MD Blake DivineAkula made aware of rate and and BP; advised to give another 1000 cc bolus, and perform EKG. MD Blake DivineAkula also made aware will come to see patient and consult CCM. Will continue to monitor. Self and Consulting civil engineerCharge RN at bedside.

## 2014-08-28 NOTE — Progress Notes (Signed)
Transferred to ICU Stepdown via bed to room 1246. Accompanied by RN, NT and family.

## 2014-08-28 NOTE — Progress Notes (Signed)
Dr. Blake DivineAkula aware via phone pt BP 82/63 with HR 100, Resp 20 and O2 sats 94% on room air. Afebrile @97 .6. MD aware family called RN to room around 1300 and reported "pt had  starting jerking side to side suddenly lasting a minute or so". Pt calm upon arrival to room. Pt denied pain except for "a little aching in lt knee"-no medication needed. Denied visual disturbances. Grips moderate and lower leg strength moderate against resistance. Radial pulses and dorsal pedis pulses +2. See new orders in EPIC for fluid bolus. Dr. Blake DivineAkula to see pt soon. Family reassured verbally.

## 2014-08-28 NOTE — Progress Notes (Signed)
Dr. Blake DivineAkula aware via phone of pt's recent BP's. Pt has been very sleepy since earlier episode witnessed by family. Able to arouse yet not for extended time. Family reassured. Dr. Blake DivineAkula to round. See new orders entered into EPIC by MD.

## 2014-08-28 NOTE — Progress Notes (Signed)
ANTICOAGULATION CONSULT NOTE - Initial Consult  Pharmacy Consult for Heparin Indication: atrial fibrillation  No Known Allergies  Patient Measurements: Height: 5\' 7"  (170.2 cm) Weight: 79 lb 8 oz (36.061 kg) IBW/kg (Calculated) : 61.6 Heparin Dosing Weight: 36 kg  Vital Signs: Temp: 98.2 F (36.8 C) (11/15 1445) Temp Source: Oral (11/15 1445) BP: 99/70 mmHg (11/15 1700) Pulse Rate: 36 (11/15 1700)  Labs:  Recent Labs  08/27/14 0719 08/27/14 0927 08/28/14 0606 08/28/14 1643  HGB 12.6  --  10.5*  --   HCT 39.3  --  33.7*  --   PLT 648*  --  508*  --   CREATININE  --  0.72  --  1.29*    Estimated Creatinine Clearance: 20.8 mL/min (by C-G formula based on Cr of 1.29).   Medical History: Past Medical History  Diagnosis Date  . Hypertension   . Fracture of pelvis, ischium     Medications:  Scheduled:  . antiseptic oral rinse  7 mL Mouth Rinse q12n4p  . chlorhexidine  15 mL Mouth Rinse BID  . metronidazole  500 mg Intravenous Once  . piperacillin-tazobactam (ZOSYN)  IV  3.375 g Intravenous Q8H   Infusions:  . sodium chloride 125 mL/hr at 08/28/14 1730  . amiodarone 60 mg/hr (08/28/14 1723)   Followed by  . amiodarone    . phenylephrine (NEO-SYNEPHRINE) Adult infusion 30 mcg/min (08/28/14 1745)   PRN: morphine injection, ondansetron **OR** ondansetron (ZOFRAN) IV  Assessment: 79 yo female admitted with sepsis probably secondary to acute cholecystitis vs gastroenteritis, deemed to be high risk for surgery. Developed hypotension requiring fluids and vasopressors, transferred to stepdown unit. Then developed tachycardia, EKG shows atrial fibrillation. Pharmacy consulted to dose IV heparin.   Baseline pTT, PT/INR pending  Hgb slightly decreased from admit, 10.5. Pltc high  SCr acutely rising to 1.29, CrCl 20 ml/min  No bleeding reported   Goal of Therapy:  Heparin level 0.3-0.7 units/ml Monitor platelets by anticoagulation protocol: Yes   Plan:    Heparin 900 units IV bolus  Heparin 500 units/hr IV infusion  Check heparin level in 8hrs  Daily heparin level, CBC  Loralee PacasErin Jaydin Boniface, PharmD, BCPS Pager: [phone] 08/28/2014,6:18 PM

## 2014-08-28 NOTE — Progress Notes (Addendum)
[facility] PROGRESS NOTE  Tami GhaziVera Madera ZOX:[id number] DOB: 12-24-1934 DOA: 08/27/2014 PCP: Olivia Mackieho, William, MD    INTERIM SUMMARY: Tami Keith is a 79 y.o. female With prior h/o hypertension, coming from [facility], presents with persistent nausea, vomiting and abdominal pain and diarrhea for more than week. On arrival to ED, CT abdomen revealed findings consistent with cholecystitis. She denies fever, but reports chills and generalized weakness. Labs revealed elevated wbc count and thrombocytosis. She is referred to medical service for admission and surgery Dr Ezzard StandingNewman consulted. She was deemed high risk for surgery and was managed with IV antibiotics. Earlier today patient was found to have a BP of 80/60's , she was given two boluses of NS of 500ml and she remained hypotensive. She was transferred to stepdown for vasopressors. Her HR running between 140- to 180's in the last one hour. 12 lead EKG shows questionable atrial fibrillation.    Assessment/Plan: 1. Sepsis probably secondary to acute cholecystitis vs gastroenteritis: Her abdomen is benign, and she does not endorse any pain, nausea, vomiting . She had one watery bowel movement this afternoon. C diff PCR is pending. PCCM Consulted for hypotension. She was transferred to stepdown and started on neo by PCCM.  Continue with IV fluids and IV zosyn.  Family at bedside updated of the patient's condition.   Code Status: DNR Family Communication: Discussed with multiple family members at bedside Disposition Plan: pending further investigation.    Consultants:  Surgery  PCCM.  Procedures:  CT abdomen and pelvis  [place] ABD  Antibiotics: IV zosyn  11/14  HPI/Subjective: She is sleepy, denies any nausea, vomiting or abdominal pain.  Objective: Filed Vitals:   08/28/14 1600  BP: 69/47  Pulse: 145  Temp:   Resp:     Intake/Output Summary (Last 24 hours) at 08/28/14 1658 Last data filed at 08/28/14 1400  Gross per 24  hour  Intake 1082.5 ml  Output      0 ml  Net 1082.5 ml   Filed Weights   08/27/14 1710 08/27/14 2107  Weight: 36.288 kg (80 lb) 36.061 kg (79 lb 8 oz)    Exam:   General:  Alert afebrile lethargic  Cardiovascular: s1s2, tachycardic  Respiratory: chest clear to auscultation, no wheezing or rhonchi  Abdomen: soft non tender non distended bowel sounds heard  Musculoskeletal: no pedal edema  Neuro: is alert and oriented to place and person.   Data Reviewed: Basic Metabolic Panel:  Recent Labs Lab 08/27/14 0927  NA 142  K 4.7  CL 104  CO2 26  GLUCOSE 131*  BUN 19  CREATININE 0.72  CALCIUM 9.2   Liver Function Tests:  Recent Labs Lab 08/27/14 0927  AST 28  ALT 15  ALKPHOS 138*  BILITOT 0.2*  PROT 7.0  ALBUMIN 2.8*    Recent Labs Lab 08/27/14 0927  LIPASE 24   No results for input(s): AMMONIA in the last 168 hours. CBC:  Recent Labs Lab 08/27/14 0719 08/28/14 0606  WBC 23.4* 20.3*  NEUTROABS 19.2* 15.3*  HGB 12.6 10.5*  HCT 39.3 33.7*  MCV 99.2 100.6*  PLT 648* 508*   Cardiac Enzymes: No results for input(s): CKTOTAL, CKMB, CKMBINDEX, TROPONINI in the last 168 hours. BNP (last 3 results) No results for input(s): PROBNP in the last 8760 hours. CBG: No results for input(s): GLUCAP in the last 168 hours.  No results found for this or any previous visit (from the past 240 hour(s)).   Studies: [place] Abdomen Complete  08/28/2014  CLINICAL DATA:  79 year old female with abnormal gallbladder on recent CT.  EXAM: ULTRASOUND ABDOMEN COMPLETE  COMPARISON:  08/27/2014 CT  FINDINGS: Gallbladder: No cholelithiasis identified. Mild circumferential gallbladder wall thickening is noted measuring up to 4 mm. There is no evidence of sonographic Murphy sign or pericholecystic fluid.  Common bile duct: Diameter: 4.0 mm. There is no evidence of intrahepatic or extrahepatic biliary dilatation.  Liver: No focal lesion identified. Within normal limits in parenchymal  echogenicity.  IVC: No abnormality visualized.  Pancreas: Visualized portion unremarkable.  Spleen: Size and appearance within normal limits.  Right Kidney: Length: 9.9 cm. Severe chronic hydronephrosis is identified with severe atrophy of the right kidney. No solid mass noted.  Left Kidney: Length: 10 cm. Echogenicity within normal limits. No mass or hydronephrosis visualized.  Abdominal aorta: No aneurysm identified but the distal abdominal aorta is not well visualized secondary to overlying bowel gas.  Other findings: None.  IMPRESSION: Mild circumferential gallbladder wall thickening without cholelithiasis or other signs of acute cholecystitis. This most likely represents wall thickening from hepatic dysfunction or fluid overload/abnormality, rather than acute cholecystitis, but consider nuclear medicine study as clinically indicated.  Severe chronic right hydronephrosis with atrophy of the right kidney.   Electronically Signed   By: Laveda Abbe M.D.   On: 08/28/2014 13:58   Ct Abdomen Pelvis W Contrast  08/27/2014   CLINICAL DATA:  Nursing Home patient with acute epigastric pain, nausea, vomiting, diarrhea.  EXAM: CT ABDOMEN AND PELVIS WITH CONTRAST  TECHNIQUE: Multidetector CT imaging of the abdomen and pelvis was performed using the standard protocol following bolus administration of intravenous contrast.  CONTRAST:  50mL OMNIPAQUE IOHEXOL 300 MG/ML SOLN, OMNIPAQUE IOHEXOL 300 MG/ML SOLN  COMPARISON:  08/27/2014 abdominal series  FINDINGS: Lower chest: Minor basilar interstitial fibrosis pattern with atelectasis and trace bilateral pleural effusions. Large hiatal hernia noted. Normal heart size. No pericardial effusion.  Abdomen: Abdominal anatomy is slightly distorted secondary to the degree of chronic levoscoliosis of the spine.  Gallbladder is in the midline of the abdomen with pericholecystic fluid noted and mucosal enhancement. Small amount of free fluid tracks along the right inferior liver  margin and the right pericolic gutter. Appearance is compatible with cholecystitis. No associated biliary dilatation. Liver, biliary system, pancreas, spleen, adrenal glands, and left kidney are within normal limits for age and demonstrate no acute process.  Right kidney demonstrates a large upper pole renal cyst measuring 8.2 x 8.6 cm, image 14.  Atherosclerosis noted of the abdominal aorta without aneurysm. Aorta is tortuous again related to the scoliosis.  Negative for bowel obstruction, dilatation, ileus, or free air.  Appendix not demonstrated.  Pelvis: Trace pelvic free fluid. Prior hysterectomy noted. No acute distal bowel process. Urinary bladder unremarkable. No pelvic fluid collection, hemorrhage, abscess, adenopathy, inguinal abnormality, or hernia.  Healing fractures present of the left superior and inferior rami. Degenerative changes of the hips and spine diffusely.  Imaged proximal right hip and thigh demonstrates subcutaneous emphysema, of uncertain origin. This is partially imaged.  IMPRESSION: Abnormal gallbladder with mucosal enhancement and surrounding pericholecystic fluid. Free fluid tracks along the posterior right liver margin and right pericolic gutter. Findings are compatible with cholecystitis.  No associated biliary dilatation or obstruction.  Small bilateral pleural effusions with basilar atelectasis/fibrosis  Large hiatal hernia  8.6 cm right upper pole renal cyst  Anterior right hip and proximal thigh subcutaneous air/ emphysema without clear etiology.  Healing subacute left superior and inferior rami fractures.  These results  will be called to the ordering clinician or representative by the Radiology Department at the imaging location.   Electronically Signed   By: Ruel Favorsrevor  Shick M.D.   On: 08/27/2014 13:32   Dg Abd Acute W/chest  08/27/2014   CLINICAL DATA:  Nausea, vomiting, and diarrhea for 2 days.  EXAM: ACUTE ABDOMEN SERIES (ABDOMEN 2 VIEW & CHEST 1 VIEW)  COMPARISON:  Two-view  chest x-ray 08/03/2014  FINDINGS: A large hiatal hernia is again seen. The heart size is normal. Emphysematous changes are noted in the lungs.  Supine in decubitus imaging of the abdomen demonstrates a relatively gasless abdomen. There is no evidence for obstruction or free air. Levoconvex scoliosis of the lumbar spine is again seen. Atherosclerotic changes are noted in the aorta.  IMPRESSION: 1. No acute abnormality of the chest or abdomen. 2. Gasless abdomen without evidence for obstruction or free air. 3. Atherosclerosis. 4. Large hiatal hernia.   Electronically Signed   By: Gennette Pachris  Mattern M.D.   On: 08/27/2014 07:23    Scheduled Meds: . amiodarone  150 mg Intravenous Once  . antiseptic oral rinse  7 mL Mouth Rinse q12n4p  . chlorhexidine  15 mL Mouth Rinse BID  . enoxaparin (LOVENOX) injection  40 mg Subcutaneous Q24H  . metronidazole  500 mg Intravenous Once  . piperacillin-tazobactam (ZOSYN)  IV  3.375 g Intravenous Q8H   Continuous Infusions: . sodium chloride 75 mL/hr at 08/27/14 1750  . amiodarone     Followed by  . amiodarone    . phenylephrine (NEO-SYNEPHRINE) Adult infusion 20 mcg/min (08/28/14 1658)    Active Problems:   Cholecystitis   Acute cholecystitis   Essential hypertension   Leukocytosis    Time spent: 45 minutes.     [facility] HospitalKULA,Gram Siedlecki  [facility] Pager [phone]. If 7PM-7AM, please contact night-coverage at [email or web address], password [email or web address] 08/28/2014, 4:58 PM  LOS: 1 day      Addendum: BMP resulted and shows elevated BUN and creatinine when compared yesterday. Getting IV fluid boluses and IV NS. Probably from hypoperfusion from hypotension. EKG shows RBBB, and right heart strain, will get a D dimer and an echocardiogram evaluate further.    Kathlen ModyVijaya Kateri Balch, MD [phone]

## 2014-08-28 NOTE — Clinical Social Work Psychosocial (Signed)
Clinical Social Work Department BRIEF PSYCHOSOCIAL ASSESSMENT 08/28/2014  Patient:  Tami Keith, Tami Keith     Account Number:  [id number]     [place] date:  08/27/2014  Clinical Social Worker:  Dede Query, CLINICAL SOCIAL WORKER  Date/Time:  08/28/2014 03:55 PM  Referred by:  Physician  Date Referred:  08/28/2014 Referred for  SNF Placement   Other Referral:   Interview type:  Patient Other interview type:   daughter Tami Keith    PSYCHOSOCIAL DATA Living Status:  FACILITY Admitted from facility:  [place] [place] Level of care:  [place] Primary support name:  Loss adjuster, chartered Primary support relationship to patient:  CHILD, ADULT Degree of support available:   high    CURRENT CONCERNS  Other Concerns:    SOCIAL WORK ASSESSMENT / PLAN CSW met with pt and her family at bedside.  Pt's daughter discussed that pt had cracked her pelvis and was doing rehab at [facility] for about 3 weeks when she began throwing up.  Pt's daughter discussed that pt is having probems with her gallbladder.  Pt stated she was unsure if she wanted to go back to [facility] for rehab at discharge  Pt's daughter stated that she and her mother (pt) would discuss placment options and let CSW know if they wanted another placement.  Pt's dog was visiting at bedside. Pt also had about 5 other family members present at bedside   Assessment/plan status:   Other assessment/ plan:   Information/referral to community resources:    PATIENT'S/FAMILY'S RESPONSE TO PLAN OF CARE: Pt very pleasant with CSW. Pt stated that she was not sure she wanted to go back to [facility]. Pt's daughter stated that pt did not like her roomate at [facility] but she would be going to a different room if she went back becasue she did not ask [facility] to hold the room for pt.  Pt and daughter were going to discuss placment options. CSW will follow up with SNF placment closer to discharge   .Dede Query, LCSW [facility] Clinical Social Worker - Weekend Coverage cell #: [phone]

## 2014-08-28 NOTE — Progress Notes (Signed)
Dr. Blake DivineAkula recently in to see pt and family. Report called to Marcelino DusterMichelle, RN in ICU-stepdown regarding pt status and reason for transfer.

## 2014-08-28 NOTE — Consult Note (Signed)
PULMONARY / CRITICAL CARE MEDICINE   Name: Tami Keith MRN: [id number] DOB: Nov 16, 1934    ADMISSION DATE:  08/27/2014 CONSULTATION DATE:  08/28/2014  REFERRING MD :  Blake Divine TRH  CHIEF COMPLAINT:  Septic shock  INITIAL PRESENTATION: 79 y/o female admitted from SNF on 11/14 nausea, vomiting and diarrhea, found to have acute cholecystitis. Pulmonary critical care medicine was consulted on 08/28/2014 for septic shock and A. Fib with RVR. It should be noted that the patient's CODE STATUS is DO NOT RESUSCITATE.  STUDIES:  08/27/2014 CT abdomen pelvis findings are consistent with acute cholecystitis, small bilateral pleural effusions, large hiatal hernia, 8.6 cm right renal cyst, healing subacute left superior and inferior rami fractures, anterior right hip and proximal thigh subcutaneous air without clear etiology 08/28/2014 right upper quadrant ultrasound> No evidence of acute cholecystitis but there is gallbladder thickening, no stones, no CBD dilatation, severe chronic right hydronephrosis  SIGNIFICANT EVENTS: November 14 admitted to hospital  November 15 transferred to  ICU for A. Fib with RVR and shock   HISTORY OF PRESENT ILLNESS:  Tami Keith is a very pleasant 79 year old female with a past medical history significant for hypertension who was admitted on 08/27/2014 for treatment of abdominal pain and diarrhea. She stated that for 1 week prior to admission she had been suffering from nausea vomiting and diarrhea. On the day of admission she was noted to have abdominal pain and a CT scan was performed. This showed evidence of acute cholecystitis. She is admitted to [facility] and treated with antibiotics after a surgery consultation. The family was offered surgery versus percutaneous drainage and elected to go with antibiotics alone because of the patient's frail health. Overnight from November 14 through November 15 she had improvement in her symptoms and she was able to eat some  on November 15. However rather abruptly in the afternoon she developed the sudden onset of shortness of breath and confusion. She noted pain in her left leg at that time. She was found to be in A. Fib with RVR and had hypotension so she was moved to the ICU and pulmonary critical care medicine was consulted. She currently denies chest pain or shortness of breath and is resting comfortably. Of note, the patient had recently been residing in a nursing facility because she fell and had a pelvic fracture. She was undergoing rehabilitation there. She had been immobilized for a fairly long period time without anticoagulation.  PAST MEDICAL HISTORY :   has a past medical history of Hypertension and Fracture of pelvis, ischium.  has past surgical history that includes Appendectomy. Prior to Admission medications   Medication Sig Start Date End Date Taking? Authorizing Provider  Amino Acids-Protein Hydrolys (FEEDING SUPPLEMENT, PRO-STAT SUGAR FREE 64,) LIQD Take 30 mLs by mouth 2 (two) times daily.   Yes Historical Provider, MD  citalopram (CELEXA) 20 MG tablet Take 20 mg by mouth daily.   Yes Historical Provider, MD  HYDROcodone-acetaminophen (NORCO/VICODIN) 5-325 MG per tablet Take 1 tablet by mouth every 6 (six) hours as needed for moderate pain.   Yes Historical Provider, MD  lactose free nutrition (BOOST) LIQD Take 237 mLs by mouth 2 (two) times daily between meals.   Yes Historical Provider, MD  loperamide (IMODIUM) 2 MG capsule Take 2 mg by mouth daily as needed for diarrhea or loose stools.   Yes Historical Provider, MD  loratadine (CLARITIN) 10 MG tablet Take 10 mg by mouth daily as needed for allergies.   Yes Historical Provider,  MD  metroNIDAZOLE (FLAGYL) 500 MG tablet Take 500 mg by mouth 2 (two) times daily.   Yes Historical Provider, MD  potassium phosphate, monobasic, (K-PHOS ORIGINAL) 500 MG tablet Take 500 mg by mouth 2 (two) times daily with a meal.   Yes Historical Provider, MD   sennosides-docusate sodium (SENOKOT-S) 8.6-50 MG tablet Take 1 tablet by mouth daily.   Yes Historical Provider, MD  sodium bicarbonate 650 MG tablet Take 650 mg by mouth daily.   Yes Historical Provider, MD   No Known Allergies  FAMILY HISTORY:  has no family status information on file.  SOCIAL HISTORY:  reports that she has quit smoking. She does not have any smokeless tobacco history on file. She reports that she does not drink alcohol or use illicit drugs.  REVIEW OF SYSTEMS: Gen: Denies fever, chills, weight change, notes fatigue, night sweats HEENT: Denies blurred vision, double vision, hearing loss, tinnitus, sinus congestion, rhinorrhea, sore throat, neck stiffness, dysphagia PULM: Denies shortness of breath, cough, sputum production, hemoptysis, wheezing CV: Denies chest pain, edema, orthopnea, paroxysmal nocturnal dyspnea, palpitations GI: per history of present illness GU: Denies dysuria, hematuria, polyuria, oliguria, urethral discharge Endocrine: Denies hot or cold intolerance, polyuria, polyphagia or appetite change Derm: Denies rash, dry skin, scaling or peeling skin change Heme: Denies easy bruising, bleeding, bleeding gums Neuro: Denies headache, numbness, weakness, slurred speech, loss of memory or consciousness   SUBJECTIVE:   VITAL SIGNS: Temp:  [97.6 F (36.4 C)-98.5 F (36.9 C)] 98.2 F (36.8 C) (11/15 1445) Pulse Rate:  [36-145] 36 (11/15 1700) Resp:  [18-27] 27 (11/15 1700) BP: (69-116)/(46-82) 99/70 mmHg (11/15 1700) SpO2:  [85 %-100 %] 85 % (11/15 1700) Weight:  [36.061 kg (79 lb 8 oz)] 36.061 kg (79 lb 8 oz) (11/14 2107) HEMODYNAMICS:   VENTILATOR SETTINGS:   INTAKE / OUTPUT:  Intake/Output Summary (Last 24 hours) at 08/28/14 1801 Last data filed at 08/28/14 1400  Gross per 24 hour  Intake 1682.5 ml  Output      0 ml  Net 1682.5 ml    PHYSICAL EXAMINATION: General:  Resting comfortably in bed Neuro:  Awake and alert, interactive,  following commands, movesall 4 extremities HEENT:  NCAT, EOMI Cardiovascular:  Irregularly irregular, JVD noted Lungs:  Crackles in bases bilaterally Abdomen:  Bowel sounds are positive no tenderness on my exam, no masses, negative Murphy sign Musculoskeletal:  Decreased muscle bulk and tone Skin:  No skin breakdown  LABS:  CBC  Recent Labs Lab 08/27/14 0719 08/28/14 0606  WBC 23.4* 20.3*  HGB 12.6 10.5*  HCT 39.3 33.7*  PLT 648* 508*   Coag's No results for input(s): APTT, INR in the last 168 hours. BMET  Recent Labs Lab 08/27/14 0927 08/28/14 1643  NA 142 141  K 4.7 4.5  CL 104 106  CO2 26 23  BUN 19 24*  CREATININE 0.72 1.29*  GLUCOSE 131* 98   Electrolytes  Recent Labs Lab 08/27/14 0927 08/28/14 1643  CALCIUM 9.2 8.4  MG  --  1.8  PHOS  --  2.9   Sepsis Markers  Recent Labs Lab 08/28/14 1643  LATICACIDVEN 2.0   ABG No results for input(s): PHART, PCO2ART, PO2ART in the last 168 hours. Liver Enzymes  Recent Labs Lab 08/27/14 0927  AST 28  ALT 15  ALKPHOS 138*  BILITOT 0.2*  ALBUMIN 2.8*   Cardiac Enzymes No results for input(s): TROPONINI, PROBNP in the last 168 hours. Glucose No results for input(s): GLUCAP in the  last 168 hours.  Imaging Ct Abdomen Pelvis W Contrast  08/27/2014   CLINICAL DATA:  Nursing Home patient with acute epigastric pain, nausea, vomiting, diarrhea.  EXAM: CT ABDOMEN AND PELVIS WITH CONTRAST  TECHNIQUE: Multidetector CT imaging of the abdomen and pelvis was performed using the standard protocol following bolus administration of intravenous contrast.  CONTRAST:  50mL OMNIPAQUE IOHEXOL 300 MG/ML SOLN, OMNIPAQUE IOHEXOL 300 MG/ML SOLN  COMPARISON:  08/27/2014 abdominal series  FINDINGS: Lower chest: Minor basilar interstitial fibrosis pattern with atelectasis and trace bilateral pleural effusions. Large hiatal hernia noted. Normal heart size. No pericardial effusion.  Abdomen: Abdominal anatomy is slightly  distorted secondary to the degree of chronic levoscoliosis of the spine.  Gallbladder is in the midline of the abdomen with pericholecystic fluid noted and mucosal enhancement. Small amount of free fluid tracks along the right inferior liver margin and the right pericolic gutter. Appearance is compatible with cholecystitis. No associated biliary dilatation. Liver, biliary system, pancreas, spleen, adrenal glands, and left kidney are within normal limits for age and demonstrate no acute process.  Right kidney demonstrates a large upper pole renal cyst measuring 8.2 x 8.6 cm, image 14.  Atherosclerosis noted of the abdominal aorta without aneurysm. Aorta is tortuous again related to the scoliosis.  Negative for bowel obstruction, dilatation, ileus, or free air.  Appendix not demonstrated.  Pelvis: Trace pelvic free fluid. Prior hysterectomy noted. No acute distal bowel process. Urinary bladder unremarkable. No pelvic fluid collection, hemorrhage, abscess, adenopathy, inguinal abnormality, or hernia.  Healing fractures present of the left superior and inferior rami. Degenerative changes of the hips and spine diffusely.  Imaged proximal right hip and thigh demonstrates subcutaneous emphysema, of uncertain origin. This is partially imaged.  IMPRESSION: Abnormal gallbladder with mucosal enhancement and surrounding pericholecystic fluid. Free fluid tracks along the posterior right liver margin and right pericolic gutter. Findings are compatible with cholecystitis.  No associated biliary dilatation or obstruction.  Small bilateral pleural effusions with basilar atelectasis/fibrosis  Large hiatal hernia  8.6 cm right upper pole renal cyst  Anterior right hip and proximal thigh subcutaneous air/ emphysema without clear etiology.  Healing subacute left superior and inferior rami fractures.  These results will be called to the ordering clinician or representative by the Radiology Department at the imaging location.    Electronically Signed   By: Ruel Favors M.D.   On: 08/27/2014 13:32   Dg Abd Acute W/chest  08/27/2014   CLINICAL DATA:  Nausea, vomiting, and diarrhea for 2 days.  EXAM: ACUTE ABDOMEN SERIES (ABDOMEN 2 VIEW & CHEST 1 VIEW)  COMPARISON:  Two-view chest x-ray 08/03/2014  FINDINGS: A large hiatal hernia is again seen. The heart size is normal. Emphysematous changes are noted in the lungs.  Supine in decubitus imaging of the abdomen demonstrates a relatively gasless abdomen. There is no evidence for obstruction or free air. Levoconvex scoliosis of the lumbar spine is again seen. Atherosclerotic changes are noted in the aorta.  IMPRESSION: 1. No acute abnormality of the chest or abdomen. 2. Gasless abdomen without evidence for obstruction or free air. 3. Atherosclerosis. 4. Large hiatal hernia.   Electronically Signed   By: Gennette Pac M.D.   On: 08/27/2014 07:23     ASSESSMENT / PLAN:  PULMONARY OETT not applicable A: No acute issues P:   -O2 as needed for O2 saturation greater than 92% -Senna spirometry, flutter valve  CARDIOVASCULAR CVLnot applicable A: Shock> Presumably septic, but differential diagnosis includes pulmonary embolism considering  the RV strain pattern seen on EKG and the new onset A. Fib.  I doubt that the hemodynamic abnormality is solely due to A. Fib because her rate has improved and when I was present she was in sinus rhythm but still hypotensive. P:  -heparin drip for A. Fib -Check d-dimer, if elevated would consider lower extremity Doppler ultrasound and possibly CT angioma chest if the serum creatinine improves -Continue amiodarone drip -Continue phenylephrine as needed for mean arterial pressure greater than 65 -We will not place a central line nor will use more than 1 vasopressor as per family's and goals of care -echo tomorrow  RENAL A:  Mild acute kidney injury due to IV contrast versus prerenal from shock P:   -would hold further IV fluids considering  possible volume overload -Use pressors as needed to keep him AP greater than 65 -Renal dose meds  GASTROINTESTINAL A:  Question acute cholecystitis (acalculus?) note no stones seen on right upper quadrant ultrasound  P:   -per surgery -Nothing by mouth  HEMATOLOGIC A:  No acute issues P:  -see plan for heparin and d-dimer above  INFECTIOUS A:  Possible cholecystitis P:   Antibiotics per primary team and surgery   FAMILY  - Updates: I spoke with the patient's daughter and granddaughter at length at bedside. They're very reasonable and they do not want aggressive measures. I think that using just Neo-Synephrine alone overnight is reasonable. Her CODE STATUS is DO NOT RESUSCITATE and if she has not made dramatic improvement by tomorrow then the patient's family would like to consider withdrawing care.   TODAY'S SUMMARY: Eilene GhaziVera Stjulien has shock but I am not entirely clear that this is due to sepsis because her course had otherwise improved significantly with antibiotics overnight. I question a pulmonary embolism.  For now add heparin, check d-dimer, use Neo-Synephrine but no other pressors, amiodarone for A. Fib. If no improvement by tomorrow morning and family would like to consider stopping aggressive measures.  Critical care time by me is 45 minutes  Heber CarolinaBrent Eiley Mcginnity, MD [facility] Pager: [phone] Cell: [phone] If no response, call [phone]   08/28/2014, 6:01 PM

## 2014-08-28 NOTE — Plan of Care (Signed)
Problem: Phase I Progression Outcomes Goal: Voiding-avoid urinary catheter unless indicated Outcome: Completed/Met Date Met:  08/28/14

## 2014-08-28 NOTE — Progress Notes (Signed)
General Surgery Note  LOS: 1 day  POD -     Assessment/Plan: 1. Cholecystitis by CT scan - the patient is fairly asymptomatic on exam I have outlined the options of: 1) surgery for gall bladder disease (one daughter has had gall bladder surgery), 2) percutaneous drain, 3) antibiotics and see how she does.  For now, she wants antibiotics and see how she does. On Zosyn.  WBC - 20,300 - 08/28/2014  Abdominal US pending.  Feels better today.  Having no pain or symptoms  2. Frail elderly patient with no muscle mass 3. Recent pelvic fracture - left pubic rami fx on CT scan Walks only with assistance 4. HTN 5. Significant hiatal hernia on CT scan 6. Right renal cyst 7.  DVT prophylaxis - Lovenox 8.  On isolation while stool being checked.  Her daughters say that she has loose stools on a regular basis.   Active Problems:   Cholecystitis   Acute cholecystitis   Essential hypertension   Leukocytosis   Subjective:  Feels better.  No pain.  2 daughters in room. Objective:   Filed Vitals:   08/28/14 0558  BP: 89/67  Pulse: 99  Temp: 98.5 F (36.9 C)  Resp: 18     Intake/Output from previous day:  11/14 0701 - 11/15 0700 In: 962.5 [I.V.:912.5; IV Piggyback:50] Out: -   Intake/Output this shift:      Physical Exam:   General: Thin elderly WF who is alert and oriented.    HEENT: Normal. Pupils equal. .   Lungs: Clear.   Abdomen: Soft.  Few BS.   No localized tenderness or mass.    Lab Results:    Recent Labs  08/27/14 0719 08/28/14 0606  WBC 23.4* 20.3*  HGB 12.6 10.5*  HCT 39.3 33.7*  PLT 648* 508*    BMET   Recent Labs  08/27/14 0927  NA 142  K 4.7  CL 104  CO2 26  GLUCOSE 131*  BUN 19  CREATININE 0.72  CALCIUM 9.2    PT/INR  No results for input(s): LABPROT, INR in the last 72 hours.  ABG  No results for input(s): PHART, HCO3 in the last 72 hours.  Invalid input(s): PCO2, PO2   Studies/Results:  Ct  Abdomen Pelvis W Contrast  08/27/2014   CLINICAL DATA:  Nursing Home patient with acute epigastric pain, nausea, vomiting, diarrhea.  EXAM: CT ABDOMEN AND PELVIS WITH CONTRAST  TECHNIQUE: Multidetector CT imaging of the abdomen and pelvis was performed using the standard protocol following bolus administration of intravenous contrast.  CONTRAST:  50mL OMNIPAQUE IOHEXOL 300 MG/ML SOLN, 100mL OMNIPAQUE IOHEXOL 300 MG/ML SOLN  COMPARISON:  08/27/2014 abdominal series  FINDINGS: Lower chest: Minor basilar interstitial fibrosis pattern with atelectasis and trace bilateral pleural effusions. Large hiatal hernia noted. Normal heart size. No pericardial effusion.  Abdomen: Abdominal anatomy is slightly distorted secondary to the degree of chronic levoscoliosis of the spine.  Gallbladder is in the midline of the abdomen with pericholecystic fluid noted and mucosal enhancement. Small amount of free fluid tracks along the right inferior liver margin and the right pericolic gutter. Appearance is compatible with cholecystitis. No associated biliary dilatation. Liver, biliary system, pancreas, spleen, adrenal glands, and left kidney are within normal limits for age and demonstrate no acute process.  Right kidney demonstrates a large upper pole renal cyst measuring 8.2 x 8.6 cm, image 14.  Atherosclerosis noted of the abdominal aorta without aneurysm. Aorta is tortuous again related to the scoliosis.  Negative for bowel obstruction, dilatation, ileus, or free air.  Appendix not demonstrated.  Pelvis: Trace pelvic free fluid. Prior hysterectomy noted. No acute distal bowel process. Urinary bladder unremarkable. No pelvic fluid collection, hemorrhage, abscess, adenopathy, inguinal abnormality, or hernia.  Healing fractures present of the left superior and inferior rami. Degenerative changes of the hips and spine diffusely.  Imaged proximal right hip and thigh demonstrates subcutaneous emphysema, of uncertain origin. This is  partially imaged.  IMPRESSION: Abnormal gallbladder with mucosal enhancement and surrounding pericholecystic fluid. Free fluid tracks along the posterior right liver margin and right pericolic gutter. Findings are compatible with cholecystitis.  No associated biliary dilatation or obstruction.  Small bilateral pleural effusions with basilar atelectasis/fibrosis  Large hiatal hernia  8.6 cm right upper pole renal cyst  Anterior right hip and proximal thigh subcutaneous air/ emphysema without clear etiology.  Healing subacute left superior and inferior rami fractures.  These results will be called to the ordering clinician or representative by the Radiology Department at the imaging location.   Electronically Signed   By: Ruel Favorsrevor  Shick M.D.   On: 08/27/2014 13:32   Dg Abd Acute W/chest  08/27/2014   CLINICAL DATA:  Nausea, vomiting, and diarrhea for 2 days.  EXAM: ACUTE ABDOMEN SERIES (ABDOMEN 2 VIEW & CHEST 1 VIEW)  COMPARISON:  Two-view chest x-ray 08/03/2014  FINDINGS: A large hiatal hernia is again seen. The heart size is normal. Emphysematous changes are noted in the lungs.  Supine in decubitus imaging of the abdomen demonstrates a relatively gasless abdomen. There is no evidence for obstruction or free air. Levoconvex scoliosis of the lumbar spine is again seen. Atherosclerotic changes are noted in the aorta.  IMPRESSION: 1. No acute abnormality of the chest or abdomen. 2. Gasless abdomen without evidence for obstruction or free air. 3. Atherosclerosis. 4. Large hiatal hernia.   Electronically Signed   By: Gennette Pachris  Mattern M.D.   On: 08/27/2014 07:23     Anti-infectives:   Anti-infectives    Start     Dose/Rate Route Frequency Ordered Stop   08/28/14 0000  piperacillin-tazobactam (ZOSYN) IVPB 3.375 g     3.375 g12.5 mL/hr over 240 Minutes Intravenous Every 8 hours 08/27/14 1625     08/27/14 1630  piperacillin-tazobactam (ZOSYN) IVPB 3.375 g     3.375 g100 mL/hr over 30 Minutes Intravenous  Once  08/27/14 1625 08/27/14 1923      Ovidio Kinavid Zimere Dunlevy, MD, FACS Pager: [phone] [facility] Office: [phone] 08/28/2014

## 2014-08-29 ENCOUNTER — Other Ambulatory Visit (HOSPITAL_COMMUNITY): Payer: Medicaid Other

## 2014-08-29 ENCOUNTER — Inpatient Hospital Stay (HOSPITAL_COMMUNITY): Payer: Medicare Other

## 2014-08-29 DIAGNOSIS — R6521 Severe sepsis with septic shock: Secondary | ICD-10-CM

## 2014-08-29 DIAGNOSIS — I369 Nonrheumatic tricuspid valve disorder, unspecified: Secondary | ICD-10-CM

## 2014-08-29 DIAGNOSIS — A419 Sepsis, unspecified organism: Principal | ICD-10-CM

## 2014-08-29 DIAGNOSIS — R0602 Shortness of breath: Secondary | ICD-10-CM

## 2014-08-29 DIAGNOSIS — I4891 Unspecified atrial fibrillation: Secondary | ICD-10-CM

## 2014-08-29 DIAGNOSIS — R579 Shock, unspecified: Secondary | ICD-10-CM

## 2014-08-29 LAB — CBC
HCT: 30.9 % — ABNORMAL LOW (ref 36.0–46.0)
HEMOGLOBIN: 9.5 g/dL — AB (ref 12.0–15.0)
MCH: 31.4 pg (ref 26.0–34.0)
MCHC: 30.7 g/dL (ref 30.0–36.0)
MCV: 102 fL — ABNORMAL HIGH (ref 78.0–100.0)
Platelets: 502 10*3/uL — ABNORMAL HIGH (ref 150–400)
RBC: 3.03 MIL/uL — AB (ref 3.87–5.11)
RDW: 16 % — ABNORMAL HIGH (ref 11.5–15.5)
WBC: 17.5 10*3/uL — AB (ref 4.0–10.5)

## 2014-08-29 LAB — TROPONIN I
TROPONIN I: 0.49 ng/mL — AB (ref <0.30)
Troponin I: 0.39 ng/mL (ref <0.30)

## 2014-08-29 LAB — BASIC METABOLIC PANEL
Anion gap: 13 (ref 5–15)
BUN: 19 mg/dL (ref 6–23)
CHLORIDE: 101 meq/L (ref 96–112)
CO2: 23 meq/L (ref 19–32)
Calcium: 8.5 mg/dL (ref 8.4–10.5)
Creatinine, Ser: 1.17 mg/dL — ABNORMAL HIGH (ref 0.50–1.10)
GFR calc Af Amer: 51 mL/min — ABNORMAL LOW (ref >90)
GFR calc non Af Amer: 44 mL/min — ABNORMAL LOW (ref >90)
GLUCOSE: 116 mg/dL — AB (ref 70–99)
Potassium: 4 mEq/L (ref 3.7–5.3)
Sodium: 137 mEq/L (ref 137–147)

## 2014-08-29 LAB — HEPARIN LEVEL (UNFRACTIONATED)
HEPARIN UNFRACTIONATED: 0.11 [IU]/mL — AB (ref 0.30–0.70)
HEPARIN UNFRACTIONATED: 0.11 [IU]/mL — AB (ref 0.30–0.70)

## 2014-08-29 LAB — CORTISOL: CORTISOL PLASMA: 20 ug/dL

## 2014-08-29 MED ORDER — HEPARIN (PORCINE) IN NACL 100-0.45 UNIT/ML-% IJ SOLN
600.0000 [IU]/h | INTRAMUSCULAR | Status: DC
  Administered 2014-08-29: 600 [IU]/h via INTRAVENOUS
  Filled 2014-08-29: qty 250

## 2014-08-29 MED ORDER — HEPARIN (PORCINE) IN NACL 100-0.45 UNIT/ML-% IJ SOLN
850.0000 [IU]/h | INTRAMUSCULAR | Status: DC
  Administered 2014-08-30: 850 [IU]/h via INTRAVENOUS
  Filled 2014-08-29 (×2): qty 250

## 2014-08-29 MED ORDER — HEPARIN BOLUS VIA INFUSION
900.0000 [IU] | Freq: Once | INTRAVENOUS | Status: AC
Start: 2014-08-29 — End: 2014-08-29
  Administered 2014-08-29: 900 [IU] via INTRAVENOUS
  Filled 2014-08-29: qty 900

## 2014-08-29 MED ORDER — PHENYLEPHRINE HCL 10 MG/ML IJ SOLN
0.0000 ug/min | INTRAVENOUS | Status: DC
  Administered 2014-08-29 (×2): 60 ug/min via INTRAVENOUS
  Administered 2014-08-29 (×2): 30 ug/min via INTRAVENOUS
  Administered 2014-08-30: 20 ug/min via INTRAVENOUS
  Filled 2014-08-29 (×4): qty 2

## 2014-08-29 MED ORDER — ACETAMINOPHEN 325 MG PO TABS
650.0000 mg | ORAL_TABLET | ORAL | Status: DC | PRN
  Administered 2014-08-29 – 2014-08-30 (×3): 650 mg via ORAL
  Filled 2014-08-29 (×3): qty 2

## 2014-08-29 MED ORDER — HYDROCORTISONE NA SUCCINATE PF 100 MG IJ SOLR
50.0000 mg | Freq: Four times a day (QID) | INTRAMUSCULAR | Status: DC
Start: 2014-08-29 — End: 2014-08-31
  Administered 2014-08-29 – 2014-08-31 (×8): 50 mg via INTRAVENOUS
  Filled 2014-08-29: qty 2
  Filled 2014-08-29: qty 1
  Filled 2014-08-29: qty 2
  Filled 2014-08-29 (×3): qty 1
  Filled 2014-08-29 (×3): qty 2
  Filled 2014-08-29: qty 1
  Filled 2014-08-29 (×2): qty 2
  Filled 2014-08-29: qty 1

## 2014-08-29 MED ORDER — HEPARIN BOLUS VIA INFUSION
1000.0000 [IU] | Freq: Once | INTRAVENOUS | Status: AC
  Administered 2014-08-29: 1000 [IU] via INTRAVENOUS
  Filled 2014-08-29: qty 1000

## 2014-08-29 MED ORDER — HEPARIN (PORCINE) IN NACL 100-0.45 UNIT/ML-% IJ SOLN
700.0000 [IU]/h | INTRAMUSCULAR | Status: DC
  Filled 2014-08-29: qty 250

## 2014-08-29 MED ORDER — SODIUM CHLORIDE 0.9 % IV BOLUS (SEPSIS)
500.0000 mL | Freq: Once | INTRAVENOUS | Status: AC
  Administered 2014-08-29: 500 mL via INTRAVENOUS

## 2014-08-29 NOTE — Progress Notes (Signed)
Subjective: She feels warm like she has some fever, but is alert, and says she feels better.    Objective: Vital signs in last 24 hours: Temp:  [97.6 F (36.4 C)-99.1 F (37.3 C)] 98.7 F (37.1 C) (11/16 0800) Pulse Rate:  [26-145] 65 (11/16 0900) Resp:  [15-32] 32 (11/16 0900) BP: (62-115)/(42-73) 82/47 mmHg (11/16 0900) SpO2:  [85 %-100 %] 97 % (11/16 0900) Last BM Date: 08/28/14 120 PO +BM Cardiac diet: TM 99.1, BP mostly in the 80-100 range, but no tachycardia with it. RR is up to the 30's this AM Creatinine is better, WBC improving   Intake/Output from previous day: 11/15 0701 - 11/16 0700 In: 3931.1 [P.O.:120; I.V.:3561.1; IV Piggyback:250] Out: 200 [Stool:200] Intake/Output this shift: Total I/O In: 624.4 [I.V.:574.4; IV Piggyback:50] Out: 125 [Urine:125]  General appearance: alert, cooperative and no distress GI: soft, non-tender; bowel sounds normal; no masses,  no organomegaly  Lab Results:   Recent Labs  08/28/14 0606 08/29/14 0314  WBC 20.3* 17.5*  HGB 10.5* 9.5*  HCT 33.7* 30.9*  PLT 508* 502*    BMET  Recent Labs  08/28/14 1643 08/29/14 0314  NA 141 137  K 4.5 4.0  CL 106 101  CO2 23 23  GLUCOSE 98 116*  BUN 24* 19  CREATININE 1.29* 1.17*  CALCIUM 8.4 8.5   PT/INR  Recent Labs  08/28/14 1822  LABPROT 19.9*  INR 1.68*     Recent Labs Lab 08/27/14 0927  AST 28  ALT 15  ALKPHOS 138*  BILITOT 0.2*  PROT 7.0  ALBUMIN 2.8*     Lipase     Component Value Date/Time   LIPASE 24 08/27/2014 [id number]     Studies/Results: [place] Abdomen Complete  08/28/2014   CLINICAL DATA:  79 year old female with abnormal gallbladder on recent CT.  EXAM: ULTRASOUND ABDOMEN COMPLETE  COMPARISON:  08/27/2014 CT  FINDINGS: Gallbladder: No cholelithiasis identified. Mild circumferential gallbladder wall thickening is noted measuring up to 4 mm. There is no evidence of sonographic Murphy sign or pericholecystic fluid.  Common bile duct: Diameter:  4.0 mm. There is no evidence of intrahepatic or extrahepatic biliary dilatation.  Liver: No focal lesion identified. Within normal limits in parenchymal echogenicity.  IVC: No abnormality visualized.  Pancreas: Visualized portion unremarkable.  Spleen: Size and appearance within normal limits.  Right Kidney: Length: 9.9 cm. Severe chronic hydronephrosis is identified with severe atrophy of the right kidney. No solid mass noted.  Left Kidney: Length: 10 cm. Echogenicity within normal limits. No mass or hydronephrosis visualized.  Abdominal aorta: No aneurysm identified but the distal abdominal aorta is not well visualized secondary to overlying bowel gas.  Other findings: None.  IMPRESSION: Mild circumferential gallbladder wall thickening without cholelithiasis or other signs of acute cholecystitis. This most likely represents wall thickening from hepatic dysfunction or fluid overload/abnormality, rather than acute cholecystitis, but consider nuclear medicine study as clinically indicated.  Severe chronic right hydronephrosis with atrophy of the right kidney.   Electronically Signed   By: Laveda AbbeJeff  Hu M.D.   On: 08/28/2014 13:58   Ct Abdomen Pelvis W Contrast  08/27/2014   CLINICAL DATA:  Nursing Home patient with acute epigastric pain, nausea, vomiting, diarrhea.  EXAM: CT ABDOMEN AND PELVIS WITH CONTRAST  TECHNIQUE: Multidetector CT imaging of the abdomen and pelvis was performed using the standard protocol following bolus administration of intravenous contrast.  CONTRAST:  50mL OMNIPAQUE IOHEXOL 300 MG/ML SOLN, 100mL OMNIPAQUE IOHEXOL 300 MG/ML SOLN  COMPARISON:  08/27/2014 abdominal  series  FINDINGS: Lower chest: Minor basilar interstitial fibrosis pattern with atelectasis and trace bilateral pleural effusions. Large hiatal hernia noted. Normal heart size. No pericardial effusion.  Abdomen: Abdominal anatomy is slightly distorted secondary to the degree of chronic levoscoliosis of the spine.  Gallbladder is in  the midline of the abdomen with pericholecystic fluid noted and mucosal enhancement. Small amount of free fluid tracks along the right inferior liver margin and the right pericolic gutter. Appearance is compatible with cholecystitis. No associated biliary dilatation. Liver, biliary system, pancreas, spleen, adrenal glands, and left kidney are within normal limits for age and demonstrate no acute process.  Right kidney demonstrates a large upper pole renal cyst measuring 8.2 x 8.6 cm, image 14.  Atherosclerosis noted of the abdominal aorta without aneurysm. Aorta is tortuous again related to the scoliosis.  Negative for bowel obstruction, dilatation, ileus, or free air.  Appendix not demonstrated.  Pelvis: Trace pelvic free fluid. Prior hysterectomy noted. No acute distal bowel process. Urinary bladder unremarkable. No pelvic fluid collection, hemorrhage, abscess, adenopathy, inguinal abnormality, or hernia.  Healing fractures present of the left superior and inferior rami. Degenerative changes of the hips and spine diffusely.  Imaged proximal right hip and thigh demonstrates subcutaneous emphysema, of uncertain origin. This is partially imaged.  IMPRESSION: Abnormal gallbladder with mucosal enhancement and surrounding pericholecystic fluid. Free fluid tracks along the posterior right liver margin and right pericolic gutter. Findings are compatible with cholecystitis.  No associated biliary dilatation or obstruction.  Small bilateral pleural effusions with basilar atelectasis/fibrosis  Large hiatal hernia  8.6 cm right upper pole renal cyst  Anterior right hip and proximal thigh subcutaneous air/ emphysema without clear etiology.  Healing subacute left superior and inferior rami fractures.  These results will be called to the ordering clinician or representative by the Radiology Department at the imaging location.   Electronically Signed   By: Ruel Favorsrevor  Shick M.D.   On: 08/27/2014 13:32    Medications: . antiseptic  oral rinse  7 mL Mouth Rinse q12n4p  . chlorhexidine  15 mL Mouth Rinse BID  . piperacillin-tazobactam (ZOSYN)  IV  3.375 g Intravenous Q8H    Assessment/Plan:  Sepsis/gastroenteritis vs Cholecystitis 1. Cholecystitis by CT scan - the patient is fairly asymptomatic on exam Dr. Ezzard StandingNewman outlined the options yesterday including: 1) surgery for gall bladder disease (one daughter has had gall bladder surgery), 2) percutaneous drain, 3) antibiotics and see how she does. For now, she wants antibiotics and see how she does. On Zosyn. WBC - 20,300 - 08/28/2014 Abdominal US pending. Feels better today. Having no pain or symptoms  2. Frail elderly patient with no muscle mass 3. Recent pelvic fracture - left pubic rami fx on CT scan Walks only with assistance 4. HTN 5. Significant hiatal hernia on CT scan 6. Right renal cyst 7. DVT prophylaxis - Lovenox 8. On isolation while stool being checked. Her daughters say that she has loose stools on a regular basis. 9.  DNR   Plan:  She is tolerating what little she eats, no abdominal pain.  I would continue current course, and we will follow with you.    LOS: 2 days    Laelyn Blumenthal 08/29/2014

## 2014-08-29 NOTE — Progress Notes (Signed)
ANTICOAGULATION CONSULT NOTE - Follow Up Consult  Pharmacy Consult for Heparin Indication: atrial fibrillation  No Known Allergies  Patient Measurements: Height: 5\' 7"  (170.2 cm) Weight: 79 lb 8 oz (36.061 kg) IBW/kg (Calculated) : 61.6 Heparin Dosing Weight: actual weight  Vital Signs: Temp: 97.9 F (36.6 C) (11/16 2000) Temp Source: Oral (11/16 2000) BP: 93/49 mmHg (11/16 2030) Pulse Rate: 56 (11/16 1800)  Labs:  Recent Labs  08/27/14 0719 08/27/14 0927 08/28/14 0606 08/28/14 1643 08/28/14 1822 08/29/14 0314 08/29/14 1249 08/29/14 1730 08/29/14 2220  HGB 12.6  --  10.5*  --   --  9.5*  --   --   --   HCT 39.3  --  33.7*  --   --  30.9*  --   --   --   PLT 648*  --  508*  --   --  502*  --   --   --   APTT  --   --   --   --  32  --   --   --   --   LABPROT  --   --   --   --  19.9*  --   --   --   --   INR  --   --   --   --  1.68*  --   --   --   --   HEPARINUNFRC  --   --   --   --   --  <0.10* 0.11*  --  0.11*  CREATININE  --  0.72  --  1.29*  --  1.17*  --   --   --   TROPONINI  --   --   --   --   --   --   --  0.49*  --     Estimated Creatinine Clearance: 22.9 mL/min (by C-G formula based on Cr of 1.17).   Medications:  Infusions:  . sodium chloride 125 mL/hr at 08/29/14 2043  . heparin 700 Units/hr (08/29/14 1425)  . phenylephrine (NEO-SYNEPHRINE) Adult infusion 30 mcg/min (08/29/14 2136)    Assessment: 79 yo female admitted 11/14 with sepsis probably secondary to acute cholecystitis vs gastroenteritis, deemed to be high risk for surgery. Developed hypotension requiring fluids and vasopressors, transferred to stepdown unit. Then developed tachycardia, EKG shows atrial fibrillation. Pharmacy consulted to dose IV heparin.   Today, 08/29/2014  Heparin level remains 0.11 (subtherapeutic) despite bolus and rate increase to 600 units/hr  Rn reports no interruptions or infusion complications.  No bleeding reported or documented  D-dimer 1.57 (11/15)    Bilateral LE venous duplex:  Bilateralno evidence of DVT  Goal of Therapy:  Heparin level 0.3-0.7 units/ml Monitor platelets by anticoagulation protocol: Yes   Plan:   Give heparin 1000 units bolus IV x 1  Increase to heparin IV infusion at 850 units/hr  Heparin level 8 hours after starting  Daily heparin level and CBC  Continue to monitor H&H and platelets   Terrilee FilesLeann Orena Cavazos, PharmD  08/29/2014 11:16 PM

## 2014-08-29 NOTE — Progress Notes (Signed)
ANTICOAGULATION CONSULT NOTE - Consult  Pharmacy Consult for Heparin Indication: atrial fibrillation  No Known Allergies  Patient Measurements: Height: 5\' 7"  (170.2 cm) Weight: 79 lb 8 oz (36.061 kg) IBW/kg (Calculated) : 61.6 Heparin Dosing Weight: 36 kg  Vital Signs: Temp: 97.7 F (36.5 C) (11/15 2336) Temp Source: Oral (11/15 2336) BP: 107/57 mmHg (11/16 0200) Pulse Rate: 59 (11/16 0200)  Labs:  Recent Labs  08/27/14 0719 08/27/14 0927 08/28/14 0606 08/28/14 1643 08/28/14 1822 08/29/14 0314  HGB 12.6  --  10.5*  --   --  9.5*  HCT 39.3  --  33.7*  --   --  30.9*  PLT 648*  --  508*  --   --  502*  APTT  --   --   --   --  32  --   LABPROT  --   --   --   --  19.9*  --   INR  --   --   --   --  1.68*  --   HEPARINUNFRC  --   --   --   --   --  <0.10*  CREATININE  --  0.72  --  1.29*  --  1.17*    Estimated Creatinine Clearance: 22.9 mL/min (by C-G formula based on Cr of 1.17).   Medical History: Past Medical History  Diagnosis Date  . Hypertension   . Fracture of pelvis, ischium     Medications:  Scheduled:  . antiseptic oral rinse  7 mL Mouth Rinse q12n4p  . chlorhexidine  15 mL Mouth Rinse BID  . heparin  900 Units Intravenous Once  . piperacillin-tazobactam (ZOSYN)  IV  3.375 g Intravenous Q8H   Infusions:  . sodium chloride 125 mL/hr at 08/28/14 1730  . amiodarone 30 mg/hr (08/28/14 2245)  . heparin    . phenylephrine (NEO-SYNEPHRINE) Adult infusion 60 mcg/min (08/29/14 0207)   PRN: morphine injection, ondansetron **OR** ondansetron (ZOFRAN) IV  Assessment: 79 yo female admitted with sepsis probably secondary to acute cholecystitis vs gastroenteritis, deemed to be high risk for surgery. Developed hypotension requiring fluids and vasopressors, transferred to stepdown unit. Then developed tachycardia, EKG shows atrial fibrillation. Pharmacy consulted to dose IV heparin.   Hgb slightly decreased (10.5 --> 9.5) Pltc high (stable)  No bleeding  reported per RN  Initial heparin with heparin infusing @ 500 units/hr = < 0.1  Confirmed with RN no interruption of therapy   Goal of Therapy:  Heparin level 0.3-0.7 units/ml Monitor platelets by anticoagulation protocol: Yes   Plan:   Rebolus Heparin 900 units IV x 1  Increase Heparin to 600 units/hr IV infusion  Check heparin level in 8hrs  Daily heparin level, CBC  Terrilee FilesLeann Leonel Mccollum, PharmD  08/29/2014,4:39 AM

## 2014-08-29 NOTE — Progress Notes (Signed)
VASCULAR LAB PRELIMINARY  PRELIMINARY  PRELIMINARY  PRELIMINARY  Bilateral lower extremity venous duplex completed.    Preliminary report:  Bilateral:  No evidence of DVT, superficial thrombosis, or Baker's Cyst.   Jenicka Coxe, RVS 08/29/2014, 10:34 AM

## 2014-08-29 NOTE — Consult Note (Signed)
CARDIOLOGY CONSULT NOTE   Patient ID: Tami Keith MRN: [id number], DOB/AGE: 1935-08-22   Admit date: 08/27/2014 Date of Consult: 08/29/2014   Primary Physician: Olivia Mackieho, William, MD Primary Cardiologist: None  Pt. Profile  79 year old woman admitted from skilled nursing facility with nausea and vomiting and initial concern for acute cholecystitis.    Problem List  Past Medical History  Diagnosis Date  . Hypertension   . Fracture of pelvis, ischium     Past Surgical History  Procedure Laterality Date  . Appendectomy      as a child     Allergies  No Known Allergies  HPI   This 79 year old woman was admitted from the skilled nursing facility.  She has been treated therefore a pelvic fracture with prolonged bedrest.  She presented here with atrial fibrillation with rapid ventricular response and shock.  Initial CT of the abdomen and pelvis raised question of acute cholecystitis and noted a large hiatal hernia.  Subsequent abdominal ultrasound on 08/28/14 did not show any evidence for cholecystitis.  Echocardiogram today showed evidence of left ventricular systolic dysfunction: - Left ventricle: The cavity size was normal. Wall thickness was normal. Systolic function was moderately reduced. The estimated ejection fraction was in the range of 35% to 40%. Severe hypokinesis of the mid-apicalinferolateral and inferior myocardium; consistent with ischemia in the distribution of the right coronary or left circumflex coronary artery. Features are consistent with a pseudonormal left ventricular filling pattern, with concomitant abnormal relaxation and increased filling pressure (grade 2 diastolic dysfunction). - Tricuspid valve: There was mild-moderate regurgitation directed centrally. - Pulmonary arteries: PA peak pressure: 32 mm Hg (S).   The patient was initially treated with IV amiodarone and converted to normal sinus rhythm.  Amiodarone has been stopped  and she has maintained normal sinus rhythm.  She is now off pressors.  She is on empiric IV heparin because of suspicion of possible pulmonary emboli as a cause of her initial presentation.initial EKG showed atrial fibrillation with rapid ventricular response and right bundle branch block presentation. no prior EKG found in Epic. No troponins were drawn. Family members present this afternoon available for obtaining history state that the patient does not have any history of known heart problems.  her list of outpatient medications does not include any cardiac medications.   Inpatient Medications  . antiseptic oral rinse  7 mL Mouth Rinse q12n4p  . chlorhexidine  15 mL Mouth Rinse BID  . hydrocortisone sod succinate (SOLU-CORTEF) inj  50 mg Intravenous Q6H  . piperacillin-tazobactam (ZOSYN)  IV  3.375 g Intravenous Q8H    Family History History reviewed. No pertinent family history.   Social History History   Social History  . Marital Status: Widowed    Spouse Name: N/A    Number of Children: N/A  . Years of Education: N/A   Occupational History  . Not on file.   Social History Main Topics  . Smoking status: Former Games developermoker  . Smokeless tobacco: Not on file  . Alcohol Use: No  . Drug Use: No  . Sexual Activity: No   Other Topics Concern  . Not on file   Social History Narrative  . No narrative on file     Review of Systems  General:  No chills, fever, night sweats or weight changes.  Cardiovascular:  No chest pain, dyspnea on exertion, edema, orthopnea, palpitations, paroxysmal nocturnal dyspnea. Dermatological: No rash, lesions/masses Respiratory: No cough, dyspnea Urologic: No hematuria, dysuria Abdominal:   No nausea,  vomiting, diarrhea, bright red blood per rectum, melena, or hematemesis Neurologic:  No visual changes, wkns, changes in mental status. All other systems reviewed and are otherwise negative except as noted above.  Physical Exam  Blood pressure 89/52,  pulse 52, temperature 98.4 F (36.9 C), temperature source Oral, resp. rate 22, height 5\' 7"  (1.702 m), weight 79 lb 8 oz (36.061 kg), SpO2 100 %.  General: Pleasant, NAD Psych: Normal affect. Neuro: Alert.. Moves all extremities spontaneously. HEENT: Normal  Neck: Supple without bruits or JVD. Lungs:  Resp regular and unlabored, CTA. Heart: RRR no s3, s4, or murmurs. Abdomen: Soft, non-tender, non-distended, BS + x 4. She is not tender in the right upper quadrant this afternoon. Extremities: No clubbing, cyanosis or edema. DP/PT/Radials 2+ and equal bilaterally.  Labs  No results for input(s): CKTOTAL, CKMB, TROPONINI in the last 72 hours. Lab Results  Component Value Date   WBC 17.5* 08/29/2014   HGB 9.5* 08/29/2014   HCT 30.9* 08/29/2014   MCV 102.0* 08/29/2014   PLT 502* 08/29/2014    Recent Labs Lab 08/27/14 0927  08/29/14 0314  NA 142  < > 137  K 4.7  < > 4.0  CL 104  < > 101  CO2 26  < > 23  BUN 19  < > 19  CREATININE 0.72  < > 1.17*  CALCIUM 9.2  < > 8.5  PROT 7.0  --   --   BILITOT 0.2*  --   --   ALKPHOS 138*  --   --   ALT 15  --   --   AST 28  --   --   GLUCOSE 131*  < > 116*  < > = values in this interval not displayed. No results found for: CHOL, HDL, LDLCALC, TRIG Lab Results  Component Value Date   DDIMER 1.57* 08/28/2014    Radiology/Studies  US Abdomen Complete  08/28/2014   CLINICAL DATA:  79 year old female with abnormal gallbladder on recent CT.  EXAM: ULTRASOUND ABDOMEN COMPLETE  COMPARISON:  08/27/2014 CT  FINDINGS: Gallbladder: No cholelithiasis identified. Mild circumferential gallbladder wall thickening is noted measuring up to 4 mm. There is no evidence of sonographic Murphy sign or pericholecystic fluid.  Common bile duct: Diameter: 4.0 mm. There is no evidence of intrahepatic or extrahepatic biliary dilatation.  Liver: No focal lesion identified. Within normal limits in parenchymal echogenicity.  IVC: No abnormality visualized.   Pancreas: Visualized portion unremarkable.  Spleen: Size and appearance within normal limits.  Right Kidney: Length: 9.9 cm. Severe chronic hydronephrosis is identified with severe atrophy of the right kidney. No solid mass noted.  Left Kidney: Length: 10 cm. Echogenicity within normal limits. No mass or hydronephrosis visualized.  Abdominal aorta: No aneurysm identified but the distal abdominal aorta is not well visualized secondary to overlying bowel gas.  Other findings: None.  IMPRESSION: Mild circumferential gallbladder wall thickening without cholelithiasis or other signs of acute cholecystitis. This most likely represents wall thickening from hepatic dysfunction or fluid overload/abnormality, rather than acute cholecystitis, but consider nuclear medicine study as clinically indicated.  Severe chronic right hydronephrosis with atrophy of the right kidney.   Electronically Signed   By: Laveda Abbe M.D.   On: 08/28/2014 13:58   Ct Abdomen Pelvis W Contrast  08/27/2014   CLINICAL DATA:  Nursing Home patient with acute epigastric pain, nausea, vomiting, diarrhea.  EXAM: CT ABDOMEN AND PELVIS WITH CONTRAST  TECHNIQUE: Multidetector CT imaging of the abdomen and pelvis  was performed using the standard protocol following bolus administration of intravenous contrast.  CONTRAST:  50mL OMNIPAQUE IOHEXOL 300 MG/ML SOLN, OMNIPAQUE IOHEXOL 300 MG/ML SOLN  COMPARISON:  08/27/2014 abdominal series  FINDINGS: Lower chest: Minor basilar interstitial fibrosis pattern with atelectasis and trace bilateral pleural effusions. Large hiatal hernia noted. Normal heart size. No pericardial effusion.  Abdomen: Abdominal anatomy is slightly distorted secondary to the degree of chronic levoscoliosis of the spine.  Gallbladder is in the midline of the abdomen with pericholecystic fluid noted and mucosal enhancement. Small amount of free fluid tracks along the right inferior liver margin and the right pericolic gutter. Appearance is  compatible with cholecystitis. No associated biliary dilatation. Liver, biliary system, pancreas, spleen, adrenal glands, and left kidney are within normal limits for age and demonstrate no acute process.  Right kidney demonstrates a large upper pole renal cyst measuring 8.2 x 8.6 cm, image 14.  Atherosclerosis noted of the abdominal aorta without aneurysm. Aorta is tortuous again related to the scoliosis.  Negative for bowel obstruction, dilatation, ileus, or free air.  Appendix not demonstrated.  Pelvis: Trace pelvic free fluid. Prior hysterectomy noted. No acute distal bowel process. Urinary bladder unremarkable. No pelvic fluid collection, hemorrhage, abscess, adenopathy, inguinal abnormality, or hernia.  Healing fractures present of the left superior and inferior rami. Degenerative changes of the hips and spine diffusely.  Imaged proximal right hip and thigh demonstrates subcutaneous emphysema, of uncertain origin. This is partially imaged.  IMPRESSION: Abnormal gallbladder with mucosal enhancement and surrounding pericholecystic fluid. Free fluid tracks along the posterior right liver margin and right pericolic gutter. Findings are compatible with cholecystitis.  No associated biliary dilatation or obstruction.  Small bilateral pleural effusions with basilar atelectasis/fibrosis  Large hiatal hernia  8.6 cm right upper pole renal cyst  Anterior right hip and proximal thigh subcutaneous air/ emphysema without clear etiology.  Healing subacute left superior and inferior rami fractures.  These results will be called to the ordering clinician or representative by the Radiology Department at the imaging location.   Electronically Signed   By: Ruel Favors M.D.   On: 08/27/2014 13:32   Dg Abd Acute W/chest  08/27/2014   CLINICAL DATA:  Nausea, vomiting, and diarrhea for 2 days.  EXAM: ACUTE ABDOMEN SERIES (ABDOMEN 2 VIEW & CHEST 1 VIEW)  COMPARISON:  Two-view chest x-ray 08/03/2014  FINDINGS: A large hiatal  hernia is again seen. The heart size is normal. Emphysematous changes are noted in the lungs.  Supine in decubitus imaging of the abdomen demonstrates a relatively gasless abdomen. There is no evidence for obstruction or free air. Levoconvex scoliosis of the lumbar spine is again seen. Atherosclerotic changes are noted in the aorta.  IMPRESSION: 1. No acute abnormality of the chest or abdomen. 2. Gasless abdomen without evidence for obstruction or free air. 3. Atherosclerosis. 4. Large hiatal hernia.   Electronically Signed   By: Gennette Pac M.D.   On: 08/27/2014 07:23    ECG on 08/28/14  Atrial fibrillation with rapid ventricular response Right superior axis deviation Incomplete right bundle branch block Right ventricular hypertrophy with repolarization abnormality Inferior infarct , age undetermined Cannot rule out Anterior infarct , age undetermined Abnormal ECG  ASSESSMENT AND PLAN  1. Left ventricular systolic dysfunction with regional wall motion abnormalities suggesting coronary artery disease.  Ejection fraction 35-40% with severe inferior wall hypokinesis. 2. Mild kidney injury 3.  Paroxysmal atrial fibrillation, resolved. 4. Possible cholecystitis but seems less likely now 5. DO NOT  RESUSCITATE status  Disposition: She appears to have evidence of coronary artery disease with inferior wall hypo-kinesis. We will get a troponin and a follow-up EKG.Consider transition from IV heparin to aspirin for coronary disease when she is able to take orally, unless pulmonary feels that she needs extent of treatment with full anticoagulation for possible pulmonary emboli.  Her atrial fibrillation on this admission appears to be an acute response to her sepsis/shock and I would not commit her to long-term anticoagulation for atrial fibrillation at this point. Once she is taking orally and blood pressure is well stabilized she would benefit from low dose beta blocker and low-dose ACE inhibitor for  her left ventricular systolic dysfunction.  Signed, Cassell Clementhomas Yeudiel Mateo, MD  08/29/2014, 4:44 PM

## 2014-08-29 NOTE — Progress Notes (Signed)
Echo Lab  2D Echocardiogram completed.  Tami Keith, RDCS 08/29/2014 9:41 AM

## 2014-08-29 NOTE — Progress Notes (Signed)
[facility] PROGRESS NOTE  Tami GhaziVera Ruppert ZOX:[id number] DOB: 03/15/1935 DOA: 08/27/2014 PCP: Olivia Mackieho, William, MD    INTERIM SUMMARY: Tami Keith is a 79 y.o. female With prior h/o hypertension, coming from [facility], presents with persistent nausea, vomiting and abdominal pain and diarrhea for more than week. On arrival to ED, CT abdomen revealed findings consistent with cholecystitis. She denies fever, but reports chills and generalized weakness. Labs revealed elevated wbc count and thrombocytosis. She is referred to medical service for admission and surgery Dr Ezzard StandingNewman consulted. She was deemed high risk for surgery and was managed with IV antibiotics. On 11/15 pt went into septic shock/hypovolemic shock and into new onset afib with RVR.  She was transferred to stepdown for vasopressors. She was started on Neo and IV amiodarone. She later on converted to sinus and currently remains on neo. Echocardiogram done , revealed EF of 35% and severe hypokinesis of the mid apical infero lateral and inferior wall . Cardiology consulted for evaluation for the above. Amiodarone stopped.    Assessment/Plan: 1. Sepsis probably secondary to acute cholecystitis vs gastroenteritis: Her abdomen is benign, and she does not endorse any pain, nausea, vomiting . She had one watery bowel movement this afternoon of 11/15. C diff PCR is negative, she was given a dose of flagyl empirically. PCCM Consulted for septic shock. She was transferred to stepdown and started on neo by PCCM.   recommend Continue with IV fluids and IV zosyn.  Family at bedside updated of the patient's condition.   2. Ischemic cardiomyopathy with afib with RVR Converted to sinus. Will start her on low dose b blocker after she is weaned off the neo. Cardiology consulted for further recommendations. Currently on IV heparin,.   3. Leukocytosis is improving.   4. Acute renal failure Possibly from hypotension and hypoperfusion, improved when  compared to yesterday.    5. Anemia Macrocytic. Anemia panel will be sent.   Abnormal EKG  - RBBB, and right heart strain. eval for PE pending.   Code Status: DNR Family Communication: Discussed with multiple family members at bedside Disposition Plan: pending further investigation.    Consultants:  Surgery  PCCM.  Procedures:  CT abdomen and pelvis  [place] ABD  Antibiotics: IV zosyn  11/14  HPI/Subjective: She is alert, denies any nausea, vomiting or abdominal pain.  She reports some neck pain tylenol given.   Objective: Filed Vitals:   08/29/14 1530  BP: 89/52  Pulse: 52  Temp:   Resp: 22    Intake/Output Summary (Last 24 hours) at 08/29/14 1708 Last data filed at 08/29/14 1200  Gross per 24 hour  Intake 4616.84 ml  Output    175 ml  Net 4441.84 ml   Filed Weights   08/27/14 1710 08/27/14 2107  Weight: 36.288 kg (80 lb) 36.061 kg (79 lb 8 oz)    Exam:   General:  Alert afebrile comfortable. Neck collar .   Cardiovascular: s1s2, tachycardic  Respiratory: chest clear to auscultation, no wheezing or rhonchi, scattered rales.   Abdomen: soft non tender non distended bowel sounds heard  Musculoskeletal: no pedal edema  Neuro: is alert and oriented to place and person.   Data Reviewed: Basic Metabolic Panel:  Recent Labs Lab 08/27/14 0927 08/28/14 1643 08/29/14 0314  NA 142 141 137  K 4.7 4.5 4.0  CL 104 106 101  CO2 26 23 23   GLUCOSE 131* 98 116*  BUN 19 24* 19  CREATININE 0.72 1.29* 1.17*  CALCIUM 9.2 8.4  8.5  MG  --  1.8  --   PHOS  --  2.9  --    Liver Function Tests:  Recent Labs Lab 08/27/14 0927  AST 28  ALT 15  ALKPHOS 138*  BILITOT 0.2*  PROT 7.0  ALBUMIN 2.8*    Recent Labs Lab 08/27/14 0927  LIPASE 24   No results for input(s): AMMONIA in the last 168 hours. CBC:  Recent Labs Lab 08/27/14 0719 08/28/14 0606 08/29/14 0314  WBC 23.4* 20.3* 17.5*  NEUTROABS 19.2* 15.3*  --   HGB 12.6 10.5* 9.5*  HCT  39.3 33.7* 30.9*  MCV 99.2 100.6* 102.0*  PLT 648* 508* 502*   Cardiac Enzymes: No results for input(s): CKTOTAL, CKMB, CKMBINDEX, TROPONINI in the last 168 hours. BNP (last 3 results) No results for input(s): PROBNP in the last 8760 hours. CBG: No results for input(s): GLUCAP in the last 168 hours.  Recent Results (from the past 240 hour(s))  Clostridium Difficile by PCR     Status: None   Collection Time: 08/28/14  2:30 PM  Result Value Ref Range Status   C difficile by pcr NEGATIVE NEGATIVE Final    Comment: [facility]  MRSA PCR Screening     Status: None   Collection Time: 08/28/14  7:01 PM  Result Value Ref Range Status   MRSA by PCR NEGATIVE NEGATIVE Final    Comment:        The GeneXpert MRSA Assay (FDA approved for NASAL specimens only), is one component of a comprehensive MRSA colonization surveillance program. It is not intended to diagnose MRSA infection nor to guide or monitor treatment for MRSA infections.      Studies: [place] Abdomen Complete  08/28/2014   CLINICAL DATA:  79 year old female with abnormal gallbladder on recent CT.  EXAM: ULTRASOUND ABDOMEN COMPLETE  COMPARISON:  08/27/2014 CT  FINDINGS: Gallbladder: No cholelithiasis identified. Mild circumferential gallbladder wall thickening is noted measuring up to 4 mm. There is no evidence of sonographic Murphy sign or pericholecystic fluid.  Common bile duct: Diameter: 4.0 mm. There is no evidence of intrahepatic or extrahepatic biliary dilatation.  Liver: No focal lesion identified. Within normal limits in parenchymal echogenicity.  IVC: No abnormality visualized.  Pancreas: Visualized portion unremarkable.  Spleen: Size and appearance within normal limits.  Right Kidney: Length: 9.9 cm. Severe chronic hydronephrosis is identified with severe atrophy of the right kidney. No solid mass noted.  Left Kidney: Length: 10 cm. Echogenicity within normal limits. No mass or hydronephrosis visualized.   Abdominal aorta: No aneurysm identified but the distal abdominal aorta is not well visualized secondary to overlying bowel gas.  Other findings: None.  IMPRESSION: Mild circumferential gallbladder wall thickening without cholelithiasis or other signs of acute cholecystitis. This most likely represents wall thickening from hepatic dysfunction or fluid overload/abnormality, rather than acute cholecystitis, but consider nuclear medicine study as clinically indicated.  Severe chronic right hydronephrosis with atrophy of the right kidney.   Electronically Signed   By: Laveda AbbeJeff  Hu M.D.   On: 08/28/2014 13:58    Scheduled Meds: . antiseptic oral rinse  7 mL Mouth Rinse q12n4p  . chlorhexidine  15 mL Mouth Rinse BID  . hydrocortisone sod succinate (SOLU-CORTEF) inj  50 mg Intravenous Q6H  . piperacillin-tazobactam (ZOSYN)  IV  3.375 g Intravenous Q8H   Continuous Infusions: . sodium chloride 500 mL (08/29/14 1120)  . heparin 700 Units/hr (08/29/14 1425)  . phenylephrine (NEO-SYNEPHRINE) Adult infusion Stopped (08/29/14 1556)  Active Problems:   Cholecystitis   Acute cholecystitis   Essential hypertension   Leukocytosis   Septic shock   Atrial fibrillation with rapid ventricular response   Shock    Time spent: 35 minutes.     [facility]  [facility] Pager [phone]. If 7PM-7AM, please contact night-coverage at [email or web address], password [facility] 08/29/2014, 5:08 PM  LOS: 2 days

## 2014-08-29 NOTE — Consult Note (Signed)
WOC wound consult note Reason for Consult:Non-blanchable erythema. Stage I Pru. Patient with poor nutritional status and is not turning and repositioning independently. Wound type:Pressure Pressure Ulcer POA: Yes Measurement: 4cm x 5.5cm Wound WUJ:WJXBJYbed:intact skin, no wound bed Drainage (amount, consistency, odor) none Periwound:intact, dry Dressing procedure/placement/frequency:We will protect and Pad this area with a soft silicone foam dressing, but also turn and reposition to redistribute pressure. WOC nursing team will not follow, but will remain available to this patient, the nursing and medical team.  Please re-consult if needed. Thanks, Ladona MowLaurie Jaydence Arnesen, MSN, RN, GNP, [place], CWON-AP [phone])

## 2014-08-29 NOTE — Progress Notes (Signed)
ANTICOAGULATION CONSULT NOTE - Follow Up Consult  Pharmacy Consult for Heparin Indication: atrial fibrillation  No Known Allergies  Patient Measurements: Height: 5\' 7"  (170.2 cm) Weight: 79 lb 8 oz (36.061 kg) IBW/kg (Calculated) : 61.6 Heparin Dosing Weight: actual weight  Vital Signs: Temp: 98.7 F (37.1 C) (11/16 0800) Temp Source: Oral (11/16 0800) BP: 117/61 mmHg (11/16 1200) Pulse Rate: 59 (11/16 1200)  Labs:  Recent Labs  08/27/14 0719 08/27/14 0927 08/28/14 0606 08/28/14 1643 08/28/14 1822 08/29/14 0314  HGB 12.6  --  10.5*  --   --  9.5*  HCT 39.3  --  33.7*  --   --  30.9*  PLT 648*  --  508*  --   --  502*  APTT  --   --   --   --  32  --   LABPROT  --   --   --   --  19.9*  --   INR  --   --   --   --  1.68*  --   HEPARINUNFRC  --   --   --   --   --  <0.10*  CREATININE  --  0.72  --  1.29*  --  1.17*    Estimated Creatinine Clearance: 22.9 mL/min (by C-G formula based on Cr of 1.17).   Medications:  Infusions:  . sodium chloride 500 mL (08/29/14 1120)  . amiodarone 30 mg/hr (08/29/14 0500)  . heparin 600 Units/hr (08/29/14 0500)  . phenylephrine (NEO-SYNEPHRINE) Adult infusion 60 mcg/min (08/29/14 1219)    Assessment: 79 yo female admitted 11/14 with sepsis probably secondary to acute cholecystitis vs gastroenteritis, deemed to be high risk for surgery. Developed hypotension requiring fluids and vasopressors, transferred to stepdown unit. Then developed tachycardia, EKG shows atrial fibrillation. Pharmacy consulted to dose IV heparin.   Today, 08/29/2014  Heparin level 0.11, remains subtherapeutic  Heparin infusing at 6 ml/hr  Rn reports no interruptions or infusion complications.  CBC: Hgb 9.5 (decreasing), Plt 502 (elevated)  No bleeding reported or documented  SCr 1.17, improved, with CrCl ~ 23 ml/min  D-dimer 1.57 (11/15)   Bilateral LE venous duplex:  Bilateralno evidence of DVT    Goal of Therapy:  Heparin level 0.3-0.7  units/ml Monitor platelets by anticoagulation protocol: Yes   Plan:   Give heparin 1000 units bolus IV x 1  Increase to heparin IV infusion at 700 units/hr  Heparin level 8 hours after starting  Daily heparin level and CBC  Continue to monitor H&H and platelets   Lynann Beaverhristine Prairie Stenberg PharmD, BCPS Pager [phone] 08/29/2014 12:31 PM

## 2014-08-29 NOTE — Consult Note (Signed)
PULMONARY / CRITICAL CARE MEDICINE   Name: Tami GhaziVera Keith MRN: [id number] DOB: 1935/04/09    ADMISSION DATE:  08/27/2014 CONSULTATION DATE:  08/28/2014  REFERRING MD :  Blake DivineAkula TRH  CHIEF COMPLAINT:  Septic shock  INITIAL PRESENTATION:  79 yo female from SNF with N/V/D with concern for acute cholecystitis.  Developed A fib with RVR and shock, and PCCM consulted.  Pt is DNR/DNI.  STUDIES:  11/14 CT abdomen pelvis >> acute cholecystitis, large hiatal hernia 11/15 RUQ abd u/s >> no evidence for cholecystitis, chronic Rt hydronephrosis 11/16 Echo >> 11/16 Doppler legs b/l >>  SIGNIFICANT EVENTS: 11/14 Admit, surgery consulted >> too high risk for surgery  SUBJECTIVE:  Denies dyspnea, chest pain, abdominal pain.  VITAL SIGNS: Temp:  [97.6 F (36.4 C)-99.1 F (37.3 C)] 98.7 F (37.1 C) (11/16 0800) Pulse Rate:  [26-145] 65 (11/16 1000) Resp:  [15-33] 33 (11/16 1000) BP: (62-115)/(42-73) 100/47 mmHg (11/16 1000) SpO2:  [85 %-100 %] 99 % (11/16 1000) INTAKE / OUTPUT:  Intake/Output Summary (Last 24 hours) at 08/29/14 1009 Last data filed at 08/29/14 0824  Gross per 24 hour  Intake 4255.41 ml  Output    325 ml  Net 3930.41 ml    PHYSICAL EXAMINATION: General: no distress Neuro: follows commands HEENT: no sinus tenderness Cardiovascular: regular Lungs: no wheeze Abdomen: soft Musculoskeletal: no edema Skin:  No skin breakdown  LABS:  CBC  Recent Labs Lab 08/27/14 0719 08/28/14 0606 08/29/14 0314  WBC 23.4* 20.3* 17.5*  HGB 12.6 10.5* 9.5*  HCT 39.3 33.7* 30.9*  PLT 648* 508* 502*   Coag's  Recent Labs Lab 08/28/14 1822  APTT 32  INR 1.68*   BMET  Recent Labs Lab 08/27/14 0927 08/28/14 1643 08/29/14 0314  NA 142 141 137  K 4.7 4.5 4.0  CL 104 106 101  CO2 26 23 23   BUN 19 24* 19  CREATININE 0.72 1.29* 1.17*  GLUCOSE 131* 98 116*   Electrolytes  Recent Labs Lab 08/27/14 0927 08/28/14 1643 08/29/14 0314  CALCIUM 9.2 8.4 8.5  MG  --   1.8  --   PHOS  --  2.9  --    Sepsis Markers  Recent Labs Lab 08/28/14 1643  LATICACIDVEN 2.0   Liver Enzymes  Recent Labs Lab 08/27/14 0927  AST 28  ALT 15  ALKPHOS 138*  BILITOT 0.2*  ALBUMIN 2.8*   Glucose No results for input(s): GLUCAP in the last 168 hours.  Imaging [place] Abdomen Complete  08/28/2014   CLINICAL DATA:  79 year old female with abnormal gallbladder on recent CT.  EXAM: ULTRASOUND ABDOMEN COMPLETE  COMPARISON:  08/27/2014 CT  FINDINGS: Gallbladder: No cholelithiasis identified. Mild circumferential gallbladder wall thickening is noted measuring up to 4 mm. There is no evidence of sonographic Murphy sign or pericholecystic fluid.  Common bile duct: Diameter: 4.0 mm. There is no evidence of intrahepatic or extrahepatic biliary dilatation.  Liver: No focal lesion identified. Within normal limits in parenchymal echogenicity.  IVC: No abnormality visualized.  Pancreas: Visualized portion unremarkable.  Spleen: Size and appearance within normal limits.  Right Kidney: Length: 9.9 cm. Severe chronic hydronephrosis is identified with severe atrophy of the right kidney. No solid mass noted.  Left Kidney: Length: 10 cm. Echogenicity within normal limits. No mass or hydronephrosis visualized.  Abdominal aorta: No aneurysm identified but the distal abdominal aorta is not well visualized secondary to overlying bowel gas.  Other findings: None.  IMPRESSION: Mild circumferential gallbladder wall thickening without cholelithiasis or  other signs of acute cholecystitis. This most likely represents wall thickening from hepatic dysfunction or fluid overload/abnormality, rather than acute cholecystitis, but consider nuclear medicine study as clinically indicated.  Severe chronic right hydronephrosis with atrophy of the right kidney.   Electronically Signed   By: Laveda AbbeJeff  Hu M.D.   On: 08/28/2014 13:58     ASSESSMENT / PLAN:  PULMONARY A:  Atelectasis. P:   Oxygen as needed to keep SpO2 >  92% F/u CXR as needed Bronchial hygiene  CARDIOVASCULAR A:  Shock ?from acute PE >> sepsis seems less likely. A fib with RVR. Hx of HTN. P:  Continue heparin gtt F/u Echo, doppler legs Continue amiodarone Defer CT chest with contrast in setting of AKI Wean off pressors to keep MAP > 65  RENAL A:   Mild acute kidney injury due to IV contrast versus prerenal from shock. P:   Continue IV fluids Monitor renal fx, urine outpt  GASTROINTESTINAL A:   Question acute cholecystitis (acalculus?) >> note no stones seen on right upper quadrant ultrasound >> see by surgery; too high risk for surgical intervention. P:   Per primary team and surgery  HEMATOLOGIC A:  Anemia of critical illness and chronic disease. P:  F/u CBC  INFECTIOUS A:   Possible cholecystitis. P:   Day 3 of zosyn per primary team >> likely can d/c Abx soon since concern for cholecystitis seems less likely  ENDOCRINE A: Relative adrenal insufficiency >> cortisol 20 from 11/15. P: Add stress steroids 11/16 Check TSH   FAMILY UPDATE: 11/15 >> DNR >> if no improvement, then transition to comfort measures  Updated daughter at bedside, and d/w with Dr. Ezzard StandingNewman.  CC time 35 minutes.  Coralyn HellingVineet Vishal Sandlin, MD [facility] 08/29/2014, 10:28 AM Pager:  [phone] After 3pm call: [phone]

## 2014-08-30 DIAGNOSIS — K810 Acute cholecystitis: Secondary | ICD-10-CM

## 2014-08-30 DIAGNOSIS — I5041 Acute combined systolic (congestive) and diastolic (congestive) heart failure: Secondary | ICD-10-CM

## 2014-08-30 DIAGNOSIS — I4891 Unspecified atrial fibrillation: Secondary | ICD-10-CM

## 2014-08-30 DIAGNOSIS — I429 Cardiomyopathy, unspecified: Secondary | ICD-10-CM

## 2014-08-30 DIAGNOSIS — R570 Cardiogenic shock: Secondary | ICD-10-CM | POA: Insufficient documentation

## 2014-08-30 LAB — CBC
HCT: 30.6 % — ABNORMAL LOW (ref 36.0–46.0)
Hemoglobin: 9.6 g/dL — ABNORMAL LOW (ref 12.0–15.0)
MCH: 31 pg (ref 26.0–34.0)
MCHC: 31.4 g/dL (ref 30.0–36.0)
MCV: 98.7 fL (ref 78.0–100.0)
Platelets: 370 10*3/uL (ref 150–400)
RBC: 3.1 MIL/uL — AB (ref 3.87–5.11)
RDW: 15.7 % — ABNORMAL HIGH (ref 11.5–15.5)
WBC: 13.6 10*3/uL — AB (ref 4.0–10.5)

## 2014-08-30 LAB — COMPREHENSIVE METABOLIC PANEL
ALBUMIN: 1.9 g/dL — AB (ref 3.5–5.2)
ALT: 10 U/L (ref 0–35)
AST: 10 U/L (ref 0–37)
Alkaline Phosphatase: 84 U/L (ref 39–117)
Anion gap: 14 (ref 5–15)
BUN: 15 mg/dL (ref 6–23)
CALCIUM: 8.6 mg/dL (ref 8.4–10.5)
CO2: 20 meq/L (ref 19–32)
Chloride: 106 mEq/L (ref 96–112)
Creatinine, Ser: 1.09 mg/dL (ref 0.50–1.10)
GFR calc Af Amer: 55 mL/min — ABNORMAL LOW (ref >90)
GFR, EST NON AFRICAN AMERICAN: 48 mL/min — AB (ref >90)
Glucose, Bld: 143 mg/dL — ABNORMAL HIGH (ref 70–99)
Potassium: 3.9 mEq/L (ref 3.7–5.3)
Sodium: 140 mEq/L (ref 137–147)
Total Bilirubin: <0.2 mg/dL — ABNORMAL LOW (ref 0.3–1.2)
Total Protein: 5.7 g/dL — ABNORMAL LOW (ref 6.0–8.3)

## 2014-08-30 LAB — TSH: TSH: 0.56 u[IU]/mL (ref 0.350–4.500)

## 2014-08-30 LAB — HEPARIN LEVEL (UNFRACTIONATED): HEPARIN UNFRACTIONATED: 0.28 [IU]/mL — AB (ref 0.30–0.70)

## 2014-08-30 MED ORDER — ALUM & MAG HYDROXIDE-SIMETH 200-200-20 MG/5ML PO SUSP
30.0000 mL | Freq: Four times a day (QID) | ORAL | Status: DC | PRN
  Administered 2014-08-30: 30 mL via ORAL
  Filled 2014-08-30: qty 30

## 2014-08-30 MED ORDER — BOOST / RESOURCE BREEZE PO LIQD
1.0000 | Freq: Three times a day (TID) | ORAL | Status: DC
  Administered 2014-08-30: 1 via ORAL

## 2014-08-30 MED ORDER — HEPARIN (PORCINE) IN NACL 100-0.45 UNIT/ML-% IJ SOLN
900.0000 [IU]/h | INTRAMUSCULAR | Status: DC
  Administered 2014-08-30: 900 [IU]/h via INTRAVENOUS
  Filled 2014-08-30: qty 250

## 2014-08-30 NOTE — Progress Notes (Signed)
Called by nursing regarding patient's recurrent atrial fibrillation with RVR. She had been in atrial fibrillation previously this hospitalization and converted to NSR. Now back in atrial fib since the early am. She has been made comfort care after a palliative care consult. The family does not wish to escalate care. Pressors have been stopped and she remains hypotensive. If the patient and family wish to treat her atrial fibrillation, would recommend resuming IV amiodarone but I would not do this if she is comfort care only. In regards to heparin infusion, I would not continue if she is comfort care only. Please call me with questions.   MCALHANY,CHRISTOPHER 08/30/2014 2:40 PM

## 2014-08-30 NOTE — Progress Notes (Signed)
     SUBJECTIVE: No chest pain. Mild SOB.   BP 101/65 mmHg  Pulse 56  Temp(Src) 97.4 F (36.3 C) (Oral)  Resp 21  Ht 5\' 7"  (1.702 m)  Wt 98 lb 15.8 oz (44.9 kg)  BMI 15.50 kg/m2  SpO2 99%  Intake/Output Summary (Last 24 hours) at 08/30/14 0606 Last data filed at 08/30/14 0425  Gross per 24 hour  Intake 2661.78 ml  Output    175 ml  Net 2486.78 ml    PHYSICAL EXAM General: Cachectic elderly female. No acute distress. Alert and oriented x 3.  Psych:  Good affect, responds appropriately Neck: No JVD. No masses noted.  Lungs: Clear bilaterally with no wheezes or rhonci noted.  Heart: RRR with no murmurs noted. Abdomen: Bowel sounds are present. Soft, non-tender.  Extremities: No lower extremity edema.   LABS: Basic Metabolic Panel:  Recent Labs  16/07/9610/15/15 1643 08/29/14 0314 08/30/14 0405  NA 141 137 140  K 4.5 4.0 3.9  CL 106 101 106  CO2 23 23 20   GLUCOSE 98 116* 143*  BUN 24* 19 15  CREATININE 1.29* 1.17* 1.09  CALCIUM 8.4 8.5 8.6  MG 1.8  --   --   PHOS 2.9  --   --    CBC:  Recent Labs  08/27/14 0719 08/28/14 0606 08/29/14 0314 08/30/14 0405  WBC 23.4* 20.3* 17.5* 13.6*  NEUTROABS 19.2* 15.3*  --   --   HGB 12.6 10.5* 9.5* 9.6*  HCT 39.3 33.7* 30.9* 30.6*  MCV 99.2 100.6* 102.0* 98.7  PLT 648* 508* 502* 370   Cardiac Enzymes:  Recent Labs  08/29/14 1730 08/29/14 2220  TROPONINI 0.49* 0.39*   Current Meds: . antiseptic oral rinse  7 mL Mouth Rinse q12n4p  . chlorhexidine  15 mL Mouth Rinse BID  . hydrocortisone sod succinate (SOLU-CORTEF) inj  50 mg Intravenous Q6H  . piperacillin-tazobactam (ZOSYN)  IV  3.375 g Intravenous Q8H    ASSESSMENT AND PLAN: 79 yo woman admitted with initial concerns for cholecystitis, developed atrial fib with RVR, treated with IV amiodarone and converted to NSR. Amiodarone has been stopped and she has maintained normal sinus rhythm. Also septic shock. She is now off pressors. She is on empiric IV heparin  because of suspicion of possible pulmonary emboli as a cause of her initial presentation.  Cardiology consulted for LV systolic dysfunction, LVEF=35-40%. Consult by Dr. Patty SermonsBrackbill 08/29/14.   1. Cardiomyopathy (? Ischemic)/Troponin elevation: Ejection fraction 35-40% with severe inferior wall hypokinesis. Mild troponin elevation, peak at 0.49, now trending down. This could represent ACS although her troponin elevation could be secondary to demand ischemia in setting of septic shock. EKG reviewed by me shows sinus, PACs, RBBB, T wave flattening lateral leads. She will need addition of beta blocker and Ace-inh as BP tolerates. Once she recovers from her acute illness, can consider ischemic evaluation if the patient and family wishes to be aggressive.   2. Atrial fib, paroxysmal: Now in sinus. Would not commit her to long term anti-coagulation given isolated episode of atrial fibrillation unless she has recurrent events. Would start ASA when IV heparin is stopped. No cardiac indication for IV heparin  MCALHANY,CHRISTOPHER  11/17/20156:06 AM

## 2014-08-30 NOTE — Progress Notes (Signed)
Summary Note : Heart Rhythm has remained Sinus Brady, rate 47-58, RBBB. Neosynephrine slowly weaned to Off this AM. Currently on NS infusion at 15225ml/hr, and Heparin infusion at 850 units/hr. No Nausea, vomiting. Denies pain. Remains incontinent of urine and stool. Very pale and hypoperfused. Condition stable but guarded. She is a DNR.

## 2014-08-30 NOTE — Progress Notes (Signed)
ANTIBIOTIC CONSULT NOTE - FOLLOW UP  Pharmacy Consult for Zosyn Indication: Intra-abdominal infection/cholecystitis  No Known Allergies  Patient Measurements: Height: 5\' 7"  (170.2 cm) Weight: 98 lb 15.8 oz (44.9 kg) IBW/kg (Calculated) : 61.6  Vital Signs: Temp: 97.4 F (36.3 C) (11/17 0800) Temp Source: Oral (11/17 0800) BP: 78/51 mmHg (11/17 1000) Intake/Output from previous day: 11/16 0701 - 11/17 0700 In: 3272.3 [P.O.:200; I.V.:2918.3; IV Piggyback:154] Out: 175 [Urine:175]  Labs:  Recent Labs  08/28/14 0606 08/28/14 1643 08/29/14 0314 08/30/14 0405  WBC 20.3*  --  17.5* 13.6*  HGB 10.5*  --  9.5* 9.6*  PLT 508*  --  502* 370  CREATININE  --  1.29* 1.17* 1.09   Estimated Creatinine Clearance: 30.6 mL/min (by C-G formula based on Cr of 1.09).   Assessment: 79 y/o F with PMH of HTN presenting with persistent nausea, vomiting, abdominal pain, and diarrhea for more than a week. CT of abdomen done in ED revealed findings consistent with cholecystitis. Pharmacy has been consulted to assist with Zosyn dosing.  11/14 >> Zosyn >>  Today, 08/30/2014 : Day #4 Zosyn  Tmax: afeb  WBCs: elevated but improving, 13.6 (solucortef)  Renal: SCr 1.09, CrCl ~ 30 ml/min CG (~49 ml/min N, weight only 36kg)  Planning transition to palliative care.   Goal of Therapy:  Appropriate abx dosing, eradication of infection.   Plan:   Continue Zosyn 3.375g IV Q8H infused over 4hrs.  Follow up renal function and cultures as available.  Follow up duration:  MD, can antibiotics be narrowed if low suspicion for cholecystitis?  Lynann Beaverhristine Carsyn Boster PharmD, BCPS Pager [phone] 08/30/2014 3:49 PM

## 2014-08-30 NOTE — Progress Notes (Signed)
ANTICOAGULATION CONSULT NOTE - Follow Up Consult  Pharmacy Consult for Heparin Indication: atrial fibrillation  No Known Allergies  Patient Measurements: Height: 5\' 7"  (170.2 cm) Weight: 98 lb 15.8 oz (44.9 kg) IBW/kg (Calculated) : 61.6 Heparin Dosing Weight: actual weight  Vital Signs: Temp: 97.4 F (36.3 C) (11/17 0400) Temp Source: Oral (11/17 0400) BP: 74/49 mmHg (11/17 0800)  Labs:  Recent Labs  08/28/14 0606 08/28/14 1643 08/28/14 1822 08/29/14 0314 08/29/14 1249 08/29/14 1730 08/29/14 2220 08/30/14 0405  HGB 10.5*  --   --  9.5*  --   --   --  9.6*  HCT 33.7*  --   --  30.9*  --   --   --  30.6*  PLT 508*  --   --  502*  --   --   --  370  APTT  --   --  32  --   --   --   --   --   LABPROT  --   --  19.9*  --   --   --   --   --   INR  --   --  1.68*  --   --   --   --   --   HEPARINUNFRC  --   --   --  <0.10* 0.11*  --  0.11*  --   CREATININE  --  1.29*  --  1.17*  --   --   --  1.09  TROPONINI  --   --   --   --   --  0.49* 0.39*  --     Estimated Creatinine Clearance: 30.6 mL/min (by C-G formula based on Cr of 1.09).   Medications:  Infusions:  . sodium chloride 125 mL/hr at 08/30/14 0356  . heparin 850 Units/hr (08/30/14 0130)    Assessment: 79 yo female admitted 11/14 with sepsis probably secondary to acute cholecystitis vs gastroenteritis, deemed to be high risk for surgery. Developed hypotension requiring fluids and vasopressors, transferred to stepdown unit. Then developed tachycardia, EKG shows atrial fibrillation. Pharmacy consulted to dose IV heparin.   Today, 08/30/2014  Heparin level 0.28, remains just below therapeutic range  Heparin infusing at 8.5 ml/hr  Rn reports no interruptions or infusion complications.  CBC: Hgb 9.6 (stable) and Plt 370  No bleeding reported or documented  SCr 1.09, improved, with CrCl ~ 30 ml/min  Bilateral LE venous duplex:  Bilateralno evidence of DVT  Rn reports pt is back in Afib this  morning   Goal of Therapy:  Heparin level 0.3-0.7 units/ml Monitor platelets by anticoagulation protocol: Yes   Plan:   Increase to heparin IV infusion at 900 units/hr  Heparin level 8 hours after starting  Daily heparin level and CBC  Continue to monitor H&H and platelets    Lynann Beaverhristine Kimblery Diop PharmD, BCPS Pager [phone] 08/30/2014 8:45 AM

## 2014-08-30 NOTE — Consult Note (Signed)
Patient Tami Keith      DOB: 1935-10-10      WGY:[id number]     Consult Note from the Palliative Medicine Team at [facility] Requested by: Dr. Karleen Hampshire     PCP: Rodena Medin, MD Reason for Consultation: [place]     Phone Number:[phone]  Assessment of patients Current state: I met today with Tami Keith, daughter Tami Keith, and granddaughter Tami Keith who are her main support. Tami Keith is HCPOA and plans to bring paperwork for [place] to make a copy for the chart. They have a good understanding of Tami Keith's very poor prognosis and acute issues with atrial fibrillation with pauses and hypotension. They have decided that comfort is a priority and that we will maintain her current treatment with IVF, heparin infusion, and antibiotics as indicated. They are okay with moving to regular room. We will not escalate care at this point. They are hoping to get enough help and coordination to get her home with hospice care as they feel it is important for her to be at home with her poodle, Fredderick Severance, and being with family is also important to her. Tami Keith is sitting up in the chair and participates in part of the conversation but is very hard of hearing and unable to fully participate. Family did not want to discuss hospice with her at this time. Tami Keith denies pain, anxiety, sleep disturbance. She only complains of being weak and tired at this time. She is very pleasant and interactive this afternoon. We will continue to follow.     Goals of Care: 1.  Code Status: DNR   2. Scope of Treatment: 1. Vital Signs: yes 2. Respiratory/Oxygen: yes 3. Antibiotics: yes 4. IVF: yes 5. Labs: yes 6. Telemetry: no   4. Disposition: To be determined.    3. Symptom Management:   1. Headache: Most often in the mornings for past ~8-12 months. Acetaminophen 650 mg every 4 hours prn.  2. Pain: Morphine 1 mg every 3 hours prn severe pain.  3. Weakness: Continue gentle supportive management with  antibiotics and fluids.  4. Severe protein calorie malnutrition: Feeding supplement TID. Albumin 1.9 and BMI 15.5 on 08/30/14. Severe muscle wasting noted.   4. Psychosocial: Emotional support provided to patient and family.    Brief HPI: 79 yo female admitted with nausea, vomiting and diarrhea thought r/t cholecystitis vs gastritis with treatment of septic shock. Found to have ischemic cardiomyopathy with recurrent atrial fibrillation RVR. She has been hypotensive but family has chosen to forego vasopressors after discussing ECHO results of acute systolic and diastolic CHF (EF 77%) with Dr. Halford Chessman this morning and to focus on comfort. Her hypotension limits treatment of her A. Fib. PMH significant for hypertension.    ROS: + weakness + fatigue    PMH:  Past Medical History  Diagnosis Date  . Hypertension   . Fracture of pelvis, ischium      PSH: Past Surgical History  Procedure Laterality Date  . Appendectomy      as a child   I have reviewed the FH and SH and  If appropriate update it with new information. No Known Allergies Scheduled Meds: . antiseptic oral rinse  7 mL Mouth Rinse q12n4p  . chlorhexidine  15 mL Mouth Rinse BID  . hydrocortisone sod succinate (SOLU-CORTEF) inj  50 mg Intravenous Q6H  . piperacillin-tazobactam (ZOSYN)  IV  3.375 g Intravenous Q8H   Continuous Infusions: . sodium chloride 125 mL/hr (08/30/14 1233)  .  heparin 900 Units/hr (08/30/14 1100)   PRN Meds:.acetaminophen, alum & mag hydroxide-simeth, morphine injection, ondansetron **OR** ondansetron (ZOFRAN) IV    BP 78/51 mmHg  Pulse 56  Temp(Src) 97.4 F (36.3 C) (Oral)  Resp 20  Ht 5' 7"  (1.702 m)  Wt 44.9 kg (98 lb 15.8 oz)  BMI 15.50 kg/m2  SpO2 99%   PPS: 30%   Intake/Output Summary (Last 24 hours) at 08/30/14 1404 Last data filed at 08/30/14 1100  Gross per 24 hour  Intake 2017.5 ml  Output      0 ml  Net 2017.5 ml   LBM: 11/17  Physical Exam:  General: NAD, sitting in  chair, pleasant, hard of hearing HEENT: Severe temporal muscle wasting, moist mucous membranes Chest: No labored breathing, symmetric CVS: Irreg irreg Abdomen: Soft, NT, ND Ext: MAE, no edema, warm to touch Neuro: Awake, alert, oriented to person and place  Labs: CBC    Component Value Date/Time   WBC 13.6* 08/30/2014 0405   RBC 3.10* 08/30/2014 0405   HGB 9.6* 08/30/2014 0405   HCT 30.6* 08/30/2014 0405   PLT 370 08/30/2014 0405   MCV 98.7 08/30/2014 0405   MCH 31.0 08/30/2014 0405   MCHC 31.4 08/30/2014 0405   RDW 15.7* 08/30/2014 0405   LYMPHSABS 2.4 08/28/2014 0606   MONOABS 2.6* 08/28/2014 0606   EOSABS 0.0 08/28/2014 0606   BASOSABS 0.0 08/28/2014 0606    BMET    Component Value Date/Time   NA 140 08/30/2014 0405   K 3.9 08/30/2014 0405   CL 106 08/30/2014 0405   CO2 20 08/30/2014 0405   GLUCOSE 143* 08/30/2014 0405   BUN 15 08/30/2014 0405   CREATININE 1.09 08/30/2014 0405   CALCIUM 8.6 08/30/2014 0405   GFRNONAA 48* 08/30/2014 0405   GFRAA 55* 08/30/2014 0405    CMP     Component Value Date/Time   NA 140 08/30/2014 0405   K 3.9 08/30/2014 0405   CL 106 08/30/2014 0405   CO2 20 08/30/2014 0405   GLUCOSE 143* 08/30/2014 0405   BUN 15 08/30/2014 0405   CREATININE 1.09 08/30/2014 0405   CALCIUM 8.6 08/30/2014 0405   PROT 5.7* 08/30/2014 0405   ALBUMIN 1.9* 08/30/2014 0405   AST 10 08/30/2014 0405   ALT 10 08/30/2014 0405   ALKPHOS 84 08/30/2014 0405   BILITOT <0.2* 08/30/2014 0405   GFRNONAA 48* 08/30/2014 0405   GFRAA 55* 08/30/2014 0405     Time In Time Out Total Time Spent with Patient Total Overall Time  1250 1410 105mn 877m    Greater than 50%  of this time was spent counseling and coordinating care related to the above assessment and plan.  AlVinie SillNP Palliative Medicine Team Pager # [phone] 1825M-F 8a-5p) Team Phone # [phone]/Weekends)

## 2014-08-30 NOTE — Progress Notes (Signed)
Report called to Stark KleinElsie, Charity fundraiserN.  Transferring to room 1332.  Family at the bedside.

## 2014-08-30 NOTE — Progress Notes (Signed)
CSW continuing to follow.  Pt admitted from [facility] and disposition at this time is dependent on outcomes. Per chart, palliative medicine team consulted for goals of care/symptom management. CSW to await recommendations from PMT GOC to further assist with pt disposition needs.   CSW to continue to follow.  Loletta SpecterSuzanna Kidd, MSW, LCSW Clinical Social Work [phone]

## 2014-08-30 NOTE — Progress Notes (Addendum)
Full note to follow:  I met today with Tami Keith, daughter Horris Latino, and granddaughter Crystal who are her main support. Horris Latino is HCPOA and plans to bring paperwork for [place] to make a copy for the chart. They have a good understanding of [name]'s very poor prognosis and acute issues with atrial fibrillation with pauses and hypotension. They have decided that comfort is a priority and that we will maintain her current treatment with IVF, heparin infusion, and antibiotics as indicated. They are okay with moving to regular room. We will not escalate care at this point. They are hoping to get enough help and coordination to get her home with hospice care as they feel like being at home with her poodle, Fredderick Severance, and being with family is most important to her. [name] is sitting up in the chair and participates in part of the conversation but is very hard of hearing. Family did not want to discuss hospice with her at this time. [name] denies pain, anxiety, sleep disturbance. She only complains of being weak and tired. She is very pleasant and interactive this afternoon. We will continue to follow.   Vinie Sill, NP Palliative Medicine Team Pager # [phone] (M-F 8a-5p) Team Phone # [phone] (Nights/Weekends)

## 2014-08-30 NOTE — Progress Notes (Signed)
[facility] PROGRESS NOTE  Tami GhaziVera Keith ZOX:[id number] DOB: [birth year] DOA: 08/27/2014 PCP: Olivia Mackieho, William, MD    INTERIM SUMMARY: Tami GhaziVera Keith is a 79 y.o. female With prior h/o hypertension, coming from [facility], presents with persistent nausea, vomiting and abdominal pain and diarrhea for more than week. On arrival to ED, CT abdomen revealed findings consistent with cholecystitis. She denies fever, but reports chills and generalized weakness. Labs revealed elevated wbc count and thrombocytosis. She is referred to medical service for admission and surgery Dr Ezzard StandingNewman consulted. She was deemed high risk for surgery and was managed with IV antibiotics. On 11/15 pt went into septic shock/hypovolemic shock and into new onset afib with RVR.  She was transferred to stepdown for vasopressors. She was started on Neo and IV amiodarone. She later on converted to sinus and currently remains on neo. Echocardiogram done , revealed EF of 35% and severe hypokinesis of the mid apical infero lateral and inferior wall . Cardiology consulted for evaluation for the above. Amiodarone stopped.    Assessment/Plan: 1. Septic shock probably secondary to acute cholecystitis vs gastroenteritis: Her abdomen is benign, and she does not endorse any pain, nausea, vomiting . She had one watery bowel movement this afternoon of 11/15. C diff PCR is negative, she was given a dose of flagyl empirically. PCCM Consulted for septic shock.  She was transferred to stepdown and started on neo by PCCM.  She underwent an echocardiogram showed LVef of 35%. In view of all the above, the daughter does not want her to be subjected to any more testing and requested the pressors to be stopped. Palliative care consutled and she was transitioned to comfort measures. She will be transferred to med surg bed.  Family at bedside updated of the patient's condition.   2. Ischemic cardiomyopathy with afib with RVR Back and forth in  Afib. IV heparin  will be stopped. We can start her on IV amio vs IV BB if her BP will allow it.    3. Leukocytosis  4. Acute renal failure Possibly from hypotension and hypoperfusion, improved when compared to yesterday.    5. Anemia Macrocytic.   Abnormal EKG  - RBBB, and right heart strain.   Code Status: DNR Family Communication: Discussed with multiple family members at bedside Disposition Plan: comfort measures   Consultants:  Surgery  PCCM.  Procedures:  CT abdomen and pelvis  [place] ABD  Antibiotics: IV zosyn  11/14  HPI/Subjective: She is alert, denies any nausea, vomiting or abdominal pain.  She reports cold.   Objective: Filed Vitals:   08/30/14 1200  BP:   Pulse:   Temp:   Resp: 21    Intake/Output Summary (Last 24 hours) at 08/30/14 1430 Last data filed at 08/30/14 1400  Gross per 24 hour  Intake 2319.5 ml  Output      0 ml  Net 2319.5 ml   Filed Weights   08/27/14 1710 08/27/14 2107 08/30/14 0410  Weight: 36.288 kg (80 lb) 36.061 kg (79 lb 8 oz) 44.9 kg (98 lb 15.8 oz)    Exam:   General:  Alert afebrile comfortable. Neck collar .   Cardiovascular: s1s2, tachycardic  Respiratory: chest clear to auscultation, no wheezing or rhonchi, scattered rales.   Abdomen: soft non tender non distended bowel sounds heard  Musculoskeletal: no pedal edema  Neuro: is alert and oriented to place and person.   Data Reviewed: Basic Metabolic Panel:  Recent Labs Lab 08/27/14 [phone] 08/28/14 1643 08/29/14 0314 08/30/14  0405  NA 142 141 137 140  K 4.7 4.5 4.0 3.9  CL 104 106 101 106  CO2 26 23 23 20   GLUCOSE 131* 98 116* 143*  BUN 19 24* 19 15  CREATININE 0.72 1.29* 1.17* 1.09  CALCIUM 9.2 8.4 8.5 8.6  MG  --  1.8  --   --   PHOS  --  2.9  --   --    Liver Function Tests:  Recent Labs Lab 08/27/14 0927 08/30/14 0405  AST 28 10  ALT 15 10  ALKPHOS 138* 84  BILITOT 0.2* <0.2*  PROT 7.0 5.7*  ALBUMIN 2.8* 1.9*    Recent Labs Lab 08/27/14 0927   LIPASE 24   No results for input(s): AMMONIA in the last 168 hours. CBC:  Recent Labs Lab 08/27/14 0719 08/28/14 0606 08/29/14 0314 08/30/14 0405  WBC 23.4* 20.3* 17.5* 13.6*  NEUTROABS 19.2* 15.3*  --   --   HGB 12.6 10.5* 9.5* 9.6*  HCT 39.3 33.7* 30.9* 30.6*  MCV 99.2 100.6* 102.0* 98.7  PLT 648* 508* 502* 370   Cardiac Enzymes:  Recent Labs Lab 08/29/14 1730 08/29/14 2220  TROPONINI 0.49* 0.39*   BNP (last 3 results) No results for input(s): PROBNP in the last 8760 hours. CBG: No results for input(s): GLUCAP in the last 168 hours.  Recent Results (from the past 240 hour(s))  Clostridium Difficile by PCR     Status: None   Collection Time: 08/28/14  2:30 PM  Result Value Ref Range Status   C difficile by pcr NEGATIVE NEGATIVE Final    Comment: [facility]  MRSA PCR Screening     Status: None   Collection Time: 08/28/14  7:01 PM  Result Value Ref Range Status   MRSA by PCR NEGATIVE NEGATIVE Final    Comment:        The GeneXpert MRSA Assay (FDA approved for NASAL specimens only), is one component of a comprehensive MRSA colonization surveillance program. It is not intended to diagnose MRSA infection nor to guide or monitor treatment for MRSA infections.      Studies: No results found.  Scheduled Meds: . antiseptic oral rinse  7 mL Mouth Rinse q12n4p  . chlorhexidine  15 mL Mouth Rinse BID  . hydrocortisone sod succinate (SOLU-CORTEF) inj  50 mg Intravenous Q6H  . piperacillin-tazobactam (ZOSYN)  IV  3.375 g Intravenous Q8H   Continuous Infusions: . sodium chloride 125 mL/hr (08/30/14 1233)  . heparin 900 Units/hr (08/30/14 1400)    Active Problems:   Cholecystitis   Acute cholecystitis   Essential hypertension   Leukocytosis   Septic shock   Atrial fibrillation with rapid ventricular response   Shock   Cardiomyopathy of undetermined type   Cardiogenic shock   Acute combined systolic and diastolic congestive heart  failure    Time spent: 35 minutes.     [facility] Hospital[facility]ULA,Treven Holtman  [facility] Pager [phone]. If 7PM-7AM, please contact night-coverage at [email or web address], password [email or web address] 08/30/2014, 2:30 PM  LOS: 3 days

## 2014-08-30 NOTE — Progress Notes (Signed)
PULMONARY / CRITICAL CARE MEDICINE   Name: Tami GhaziVera Janusz MRN: [id number] DOB: 06/22/1935    ADMISSION DATE:  08/27/2014 CONSULTATION DATE:  08/28/2014  REFERRING MD :  Blake DivineAkula TRH  CHIEF COMPLAINT:  Septic shock  INITIAL PRESENTATION:  79 yo female from SNF with N/V/D with concern for acute cholecystitis.  Developed A fib with RVR and shock, and PCCM consulted.  Pt is DNR/DNI.  STUDIES:  11/14 CT abdomen pelvis >> acute cholecystitis, large hiatal hernia 11/15 RUQ abd u/s >> no evidence for cholecystitis, chronic Rt hydronephrosis 11/16 Echo >> EF 35 to 40%, grade 2 diastolic dysfx, PAS 32 mmHg, mild/mod TR 11/16 Doppler legs b/l >> no DVT  SIGNIFICANT EVENTS: 11/14 Admit, surgery consulted >> too high risk for surgery  SUBJECTIVE:  Feels better.  Transitioned off pressors this AM >> but BP low again.  Was in sinus >> developed A fib with RVR again.  VITAL SIGNS: Temp:  [97.4 F (36.3 C)-98.4 F (36.9 C)] 97.4 F (36.3 C) (11/17 0400) Pulse Rate:  [52-66] 56 (11/16 1800) Resp:  [18-33] 19 (11/17 0700) BP: (70-126)/(38-78) 87/55 mmHg (11/17 0700) SpO2:  [95 %-100 %] 100 % (11/17 0700) Weight:  [98 lb 15.8 oz (44.9 kg)] 98 lb 15.8 oz (44.9 kg) (11/17 0410) INTAKE / OUTPUT:  Intake/Output Summary (Last 24 hours) at 08/30/14 0810 Last data filed at 08/30/14 0700  Gross per 24 hour  Intake 2687.93 ml  Output    175 ml  Net 2512.93 ml    PHYSICAL EXAMINATION: General: no distress Neuro: follows commands HEENT: no sinus tenderness Cardiovascular: irregular, tachycardic Lungs: no wheeze Abdomen: soft Musculoskeletal: no edema Skin:  No skin breakdown  LABS:  CBC  Recent Labs Lab 08/28/14 0606 08/29/14 0314 08/30/14 0405  WBC 20.3* 17.5* 13.6*  HGB 10.5* 9.5* 9.6*  HCT 33.7* 30.9* 30.6*  PLT 508* 502* 370   Coag's  Recent Labs Lab 08/28/14 1822  APTT 32  INR 1.68*   BMET  Recent Labs Lab 08/28/14 1643 08/29/14 0314 08/30/14 0405  NA 141 137  140  K 4.5 4.0 3.9  CL 106 101 106  CO2 23 23 20   BUN 24* 19 15  CREATININE 1.29* 1.17* 1.09  GLUCOSE 98 116* 143*   Electrolytes  Recent Labs Lab 08/28/14 1643 08/29/14 0314 08/30/14 0405  CALCIUM 8.4 8.5 8.6  MG 1.8  --   --   PHOS 2.9  --   --    Sepsis Markers  Recent Labs Lab 08/28/14 1643  LATICACIDVEN 2.0   Liver Enzymes  Recent Labs Lab 08/27/14 0927 08/30/14 0405  AST 28 10  ALT 15 10  ALKPHOS 138* 84  BILITOT 0.2* <0.2*  ALBUMIN 2.8* 1.9*   Glucose No results for input(s): GLUCAP in the last 168 hours.  Imaging No results found.   ASSESSMENT / PLAN:  PULMONARY A:  Atelectasis. P:   Oxygen as needed to keep SpO2 > 92% F/u CXR as needed Bronchial hygiene  CARDIOVASCULAR A:  Cardiogenic shock with acute systolic/diastolic heart failure.  A fib with RVR. Hx of HTN. P:  Per primary team and cardiology >> family has indicated that they would not want aggressive therapies  RENAL A:   Mild acute kidney injury due to IV contrast versus prerenal from shock >> improving. P:   Continue IV fluids Monitor renal fx, urine outpt  GASTROINTESTINAL A:   Question acute cholecystitis (acalculus?) >> note no stones seen on right upper quadrant ultrasound >> see  by surgery; too high risk for surgical intervention. P:   Per primary team and surgery >> continue conservative management  HEMATOLOGIC A:  Anemia of critical illness and chronic disease. P:  F/u CBC  INFECTIOUS A:   Possible cholecystitis. P:   Day 4 of zosyn per primary team >> likely can d/c Abx soon since concern for cholecystitis seems less likely  ENDOCRINE A: Relative adrenal insufficiency >> cortisol 20 from 11/15. P: Added stress steroids 11/16 >> can start to wean off as tolerated on 11/18   FAMILY UPDATE: Updated pt's family at bedside on 11/17.  Discussed findings of Echo, and impact this information has on her long term prognosis.  They understand that she has  been through a lot, and do not want to subject her to additional testing that won't likely improve her quality of life.  They have confirmed DNR/DNI status, and expressed that they would not want to resume pressor agents.  I raised the option of consulting palliative care to assist with symptom management, goals of care >> her family is agreeable to palliative care consult.  PCCM will sign off at this point.  Please call if additional help is needed.   Coralyn HellingVineet Shahla Betsill, MD [facility] 08/30/2014, 8:10 AM Pager:  [phone] After 3pm call: [phone]

## 2014-08-31 DIAGNOSIS — I5041 Acute combined systolic (congestive) and diastolic (congestive) heart failure: Secondary | ICD-10-CM

## 2014-08-31 LAB — CBC
HCT: 27.6 % — ABNORMAL LOW (ref 36.0–46.0)
Hemoglobin: 8.5 g/dL — ABNORMAL LOW (ref 12.0–15.0)
MCH: 30.7 pg (ref 26.0–34.0)
MCHC: 30.8 g/dL (ref 30.0–36.0)
MCV: 99.6 fL (ref 78.0–100.0)
Platelets: 401 10*3/uL — ABNORMAL HIGH (ref 150–400)
RBC: 2.77 MIL/uL — ABNORMAL LOW (ref 3.87–5.11)
RDW: 15.8 % — AB (ref 11.5–15.5)
WBC: 10 10*3/uL (ref 4.0–10.5)

## 2014-08-31 MED ORDER — MORPHINE SULFATE 20 MG/5ML PO SOLN: 2.5000 mg | ORAL | Status: DC | PRN

## 2014-08-31 MED ORDER — ACETAMINOPHEN 325 MG PO TABS: 650.0000 mg | ORAL_TABLET | ORAL | Status: AC | PRN

## 2014-08-31 MED ORDER — ONDANSETRON HCL 4 MG PO TABS: 4.0000 mg | ORAL_TABLET | Freq: Four times a day (QID) | ORAL | Status: AC | PRN

## 2014-08-31 MED ORDER — PREDNISONE 10 MG PO TABS: 10.0000 mg | ORAL_TABLET | Freq: Two times a day (BID) | ORAL | Status: DC

## 2014-08-31 MED ORDER — CHLORHEXIDINE GLUCONATE 0.12 % MT SOLN: 15.0000 mL | Freq: Two times a day (BID) | OROMUCOSAL | Status: DC

## 2014-08-31 MED ORDER — BOOST / RESOURCE BREEZE PO LIQD: 1.0000 | Freq: Three times a day (TID) | ORAL | Status: AC

## 2014-08-31 MED ORDER — AMOXICILLIN-POT CLAVULANATE 875-125 MG PO TABS: 1.0000 | ORAL_TABLET | Freq: Two times a day (BID) | ORAL | Status: DC

## 2014-08-31 MED ORDER — ALUM & MAG HYDROXIDE-SIMETH 200-200-20 MG/5ML PO SUSP: 30.0000 mL | Freq: Four times a day (QID) | ORAL | Status: AC | PRN

## 2014-08-31 MED ORDER — CETYLPYRIDINIUM CHLORIDE 0.05 % MT LIQD: 7.0000 mL | Freq: Two times a day (BID) | OROMUCOSAL | Status: AC

## 2014-08-31 NOTE — Discharge Summary (Addendum)
Physician Discharge Summary  Tami Keith ZOX:[id number] DOB: [birth year] DOA: 08/27/2014  PCP: Olivia Mackie, MD  Admit date: 08/27/2014 Discharge date: 08/31/2014  Recommendations for Outpatient Follow-up:  1. Please have palliative care services follow in skilled nursing facility. 2. Continue prednisone 10 mg twice daily for 7 days. Please assess if patient still needs it. Please note that patient has received less than total of 2 weeks of prednisone. 3. 3. Take Augmentin for 2 weeks on discharge.  Discharge Diagnoses:  Active Problems:   Cholecystitis   Acute cholecystitis   Essential hypertension   Leukocytosis   Septic shock   Atrial fibrillation with rapid ventricular response   Shock   Cardiomyopathy of undetermined type   Cardiogenic shock   Acute combined systolic and diastolic congestive heart failure    Discharge Condition: stable   Diet recommendation: as tolerated   History of present illness:  Tami Keith is a 79 y.o. Female with past medical history of hypertension who presented from skilled nursing facility with nausea, vomiting and abdominal pain. Patient also had a diarrhea for past one week prior to this admission. On admission, CT abdomen revealed findings consistent with acute cholecystitis. Blood work revealed elevated white blood cell count and thrombocytosis. Surgery has seen the patient in consultation. Patient was determined to be a high risk for surgery and recommendation was to continue management with IV antibiotics while in hospital. On 11/15 pt went into septic shock/hypovolemic shock and into new onset afib with RVR. She was transferred to stepdown for vasopressors. She was started on Neo and IV amiodarone. She later on converted to sinus. After much discussion with the family, they all agreed they want to focus on comfort care.   Assessment/Plan:  Principal problem: Septic shock secondary to acute cholecystitis / leukocytosis - No reports of  pain, nausea or vomiting. Has had watery bowel movement  08/28/2014. - C. Difficile PCR is negative. Patient was given one dose of Flagyl empirically. - Patient has been seen by critical care and management by critical care for septic shock. - patient was on Zosyn for treatment of acute cholecystitis. We will transition to Augmentin for 2 weeks of discharge. - Off note, 2-D echo showed ejection fraction of 35%. - Discussion with the family to place 08/29/2016 and all agreed to transition to comfort measures. - Patient is stable for discharge to skilled nursing facility will palliative care service is following.  Active problems: Ischemic cardiomyopathy with atrial fibrillation with RVR - Patient converted to sinus rhythm. I spoke with the family about starting low-dose beta blocker. Patient's daughter wanted to hold off on additional medications at this time.  Anemia of chronic disease - Likely secondary to sepsis and acute infection - No further blood work, focus on comfort  Acute renal failure - Likely prerenal, hypoperfusion, sepsis - Creatinine is 1.17 prior to discharge.   Abnormal EKG  - RBBB, and right heart strain.   Code Status: DNR/ DNI Family Communication: Discussed with multiple family members at bedside Disposition Plan: comfort measures   Consultants:  Surgery  PCCM.  Procedures:  CT abdomen and pelvis  US ABD  Antibiotics: IV zosyn 11/14 --> 08/31/2014  Augmentin for 2 weeks on discahrge  Signed:  Manson Passey, MD  [facility] 08/31/2014, 11:18 AM  Pager #: [phone]    Discharge Exam: Filed Vitals:   08/31/14 0505  BP: 110/60  Pulse: 65  Temp: 97.6 F (36.4 C)  Resp: 20   Filed Vitals:   08/30/14  1615 08/30/14 1850 08/30/14 2047 08/31/14 0505  BP: 91/58 99/61 104/59 110/60  Pulse:   82 65  Temp:  98 F (36.7 C) 98.2 F (36.8 C) 97.6 F (36.4 C)  TempSrc:  Oral Oral Oral  Resp: 24 22 20 20   Height:      Weight:       SpO2: 98% 100% 97% 96%    General: Pt is  not in acute distress Cardiovascular: Regular rate and rhythm, S1/S2 appreciated  Respiratory: no wheezing, no crackles, no rhonchi Abdominal: Soft, non tender, non distended, bowel sounds +, no guarding Extremities: no cyanosis, pulses palpable bilaterally DP and PT Neuro: Grossly nonfocal  Discharge Instructions  Discharge Instructions    Call MD for:  difficulty breathing, headache or visual disturbances    Complete by:  As directed      Call MD for:  persistant dizziness or light-headedness    Complete by:  As directed      Call MD for:  persistant nausea and vomiting    Complete by:  As directed      Call MD for:  severe uncontrolled pain    Complete by:  As directed      Diet - low sodium heart healthy    Complete by:  As directed      Increase activity slowly    Complete by:  As directed             Medication List    STOP taking these medications        citalopram 20 MG tablet  Commonly known as:  CELEXA     feeding supplement (PRO-STAT SUGAR FREE 64) Liqd     HYDROcodone-acetaminophen 5-325 MG per tablet  Commonly known as:  NORCO/VICODIN     loperamide 2 MG capsule  Commonly known as:  IMODIUM     loratadine 10 MG tablet  Commonly known as:  CLARITIN     metroNIDAZOLE 500 MG tablet  Commonly known as:  FLAGYL     potassium phosphate (monobasic) 500 MG tablet  Commonly known as:  K-PHOS ORIGINAL     sennosides-docusate sodium 8.6-50 MG tablet  Commonly known as:  SENOKOT-S     sodium bicarbonate 650 MG tablet      TAKE these medications        acetaminophen 325 MG tablet  Commonly known as:  TYLENOL  Take 2 tablets (650 mg total) by mouth every 4 (four) hours as needed for fever, headache or mild pain.     alum & mag hydroxide-simeth 200-200-20 MG/5ML suspension  Commonly known as:  MAALOX/MYLANTA  Take 30 mLs by mouth every 6 (six) hours as needed for indigestion or heartburn.      amoxicillin-clavulanate 875-125 MG per tablet  Commonly known as:  AUGMENTIN  Take 1 tablet by mouth 2 (two) times daily.     antiseptic oral rinse 0.05 % Liqd solution  Commonly known as:  CPC / CETYLPYRIDINIUM CHLORIDE 0.05%  7 mLs by Mouth Rinse route 2 times daily at 12 noon and 4 pm.     chlorhexidine 0.12 % solution  Commonly known as:  PERIDEX  15 mLs by Mouth Rinse route 2 (two) times daily.     feeding supplement (RESOURCE BREEZE) Liqd  Take 1 Container by mouth 3 (three) times daily between meals.     morphine 20 MG/5ML solution  Take 0.6 mLs (2.4 mg total) by mouth every 2 (two) hours as needed for pain.  ondansetron 4 MG tablet  Commonly known as:  ZOFRAN  Take 1 tablet (4 mg total) by mouth every 6 (six) hours as needed for nausea.     predniSONE 10 MG tablet  Commonly known as:  DELTASONE  Take 1 tablet (10 mg total) by mouth 2 (two) times daily with a meal.            Follow-up Information    Follow up with Olivia Mackieho, William, MD.   Specialty:  Internal Medicine   Why:  As needed, Follow up appt after recent hospitalization   Contact information:   [address] [place] [phone]        The results of significant diagnostics from this hospitalization (including imaging, microbiology, ancillary and laboratory) are listed below for reference.    Significant Diagnostic Studies: Koreas Abdomen Complete  08/28/2014   CLINICAL DATA:  79 year old female with abnormal gallbladder on recent CT.  EXAM: ULTRASOUND ABDOMEN COMPLETE  COMPARISON:  08/27/2014 CT  FINDINGS: Gallbladder: No cholelithiasis identified. Mild circumferential gallbladder wall thickening is noted measuring up to 4 mm. There is no evidence of sonographic Murphy sign or pericholecystic fluid.  Common bile duct: Diameter: 4.0 mm. There is no evidence of intrahepatic or extrahepatic biliary dilatation.  Liver: No focal lesion identified. Within normal limits in parenchymal  echogenicity.  IVC: No abnormality visualized.  Pancreas: Visualized portion unremarkable.  Spleen: Size and appearance within normal limits.  Right Kidney: Length: 9.9 cm. Severe chronic hydronephrosis is identified with severe atrophy of the right kidney. No solid mass noted.  Left Kidney: Length: 10 cm. Echogenicity within normal limits. No mass or hydronephrosis visualized.  Abdominal aorta: No aneurysm identified but the distal abdominal aorta is not well visualized secondary to overlying bowel gas.  Other findings: None.  IMPRESSION: Mild circumferential gallbladder wall thickening without cholelithiasis or other signs of acute cholecystitis. This most likely represents wall thickening from hepatic dysfunction or fluid overload/abnormality, rather than acute cholecystitis, but consider nuclear medicine study as clinically indicated.  Severe chronic right hydronephrosis with atrophy of the right kidney.   Electronically Signed   By: Laveda AbbeJeff  Hu M.D.   On: 08/28/2014 13:58   Ct Abdomen Pelvis W Contrast  08/27/2014   CLINICAL DATA:  Nursing Home patient with acute epigastric pain, nausea, vomiting, diarrhea.  EXAM: CT ABDOMEN AND PELVIS WITH CONTRAST  TECHNIQUE: Multidetector CT imaging of the abdomen and pelvis was performed using the standard protocol following bolus administration of intravenous contrast.  CONTRAST:  50mL OMNIPAQUE IOHEXOL 300 MG/ML SOLN, 100mL OMNIPAQUE IOHEXOL 300 MG/ML SOLN  COMPARISON:  08/27/2014 abdominal series  FINDINGS: Lower chest: Minor basilar interstitial fibrosis pattern with atelectasis and trace bilateral pleural effusions. Large hiatal hernia noted. Normal heart size. No pericardial effusion.  Abdomen: Abdominal anatomy is slightly distorted secondary to the degree of chronic levoscoliosis of the spine.  Gallbladder is in the midline of the abdomen with pericholecystic fluid noted and mucosal enhancement. Small amount of free fluid tracks along the right inferior liver  margin and the right pericolic gutter. Appearance is compatible with cholecystitis. No associated biliary dilatation. Liver, biliary system, pancreas, spleen, adrenal glands, and left kidney are within normal limits for age and demonstrate no acute process.  Right kidney demonstrates a large upper pole renal cyst measuring 8.2 x 8.6 cm, image 14.  Atherosclerosis noted of the abdominal aorta without aneurysm. Aorta is tortuous again related to the scoliosis.  Negative for bowel obstruction, dilatation, ileus,  or free air.  Appendix not demonstrated.  Pelvis: Trace pelvic free fluid. Prior hysterectomy noted. No acute distal bowel process. Urinary bladder unremarkable. No pelvic fluid collection, hemorrhage, abscess, adenopathy, inguinal abnormality, or hernia.  Healing fractures present of the left superior and inferior rami. Degenerative changes of the hips and spine diffusely.  Imaged proximal right hip and thigh demonstrates subcutaneous emphysema, of uncertain origin. This is partially imaged.  IMPRESSION: Abnormal gallbladder with mucosal enhancement and surrounding pericholecystic fluid. Free fluid tracks along the posterior right liver margin and right pericolic gutter. Findings are compatible with cholecystitis.  No associated biliary dilatation or obstruction.  Small bilateral pleural effusions with basilar atelectasis/fibrosis  Large hiatal hernia  8.6 cm right upper pole renal cyst  Anterior right hip and proximal thigh subcutaneous air/ emphysema without clear etiology.  Healing subacute left superior and inferior rami fractures.  These results will be called to the ordering clinician or representative by the Radiology Department at the imaging location.   Electronically Signed   By: Ruel Favorsrevor  Shick M.D.   On: 08/27/2014 13:32   Dg Abd Acute W/chest  08/27/2014   CLINICAL DATA:  Nausea, vomiting, and diarrhea for 2 days.  EXAM: ACUTE ABDOMEN SERIES (ABDOMEN 2 VIEW & CHEST 1 VIEW)  COMPARISON:  Two-view  chest x-ray 08/03/2014  FINDINGS: A large hiatal hernia is again seen. The heart size is normal. Emphysematous changes are noted in the lungs.  Supine in decubitus imaging of the abdomen demonstrates a relatively gasless abdomen. There is no evidence for obstruction or free air. Levoconvex scoliosis of the lumbar spine is again seen. Atherosclerotic changes are noted in the aorta.  IMPRESSION: 1. No acute abnormality of the chest or abdomen. 2. Gasless abdomen without evidence for obstruction or free air. 3. Atherosclerosis. 4. Large hiatal hernia.   Electronically Signed   By: Gennette Pachris  Mattern M.D.   On: 08/27/2014 07:23    Microbiology: Recent Results (from the past 240 hour(s))  Clostridium Difficile by PCR     Status: None   Collection Time: 08/28/14  2:30 PM  Result Value Ref Range Status   C difficile by pcr NEGATIVE NEGATIVE Final    Comment: [facility]  MRSA PCR Screening     Status: None   Collection Time: 08/28/14  7:01 PM  Result Value Ref Range Status   MRSA by PCR NEGATIVE NEGATIVE Final    Comment:        The GeneXpert MRSA Assay (FDA approved for NASAL specimens only), is one component of a comprehensive MRSA colonization surveillance program. It is not intended to diagnose MRSA infection nor to guide or monitor treatment for MRSA infections.      Labs: Basic Metabolic Panel:  Recent Labs Lab 08/27/14 0927 08/28/14 1643 08/29/14 0314 08/30/14 0405  NA 142 141 137 140  K 4.7 4.5 4.0 3.9  CL 104 106 101 106  CO2 26 23 23 20   GLUCOSE 131* 98 116* 143*  BUN 19 24* 19 15  CREATININE 0.72 1.29* 1.17* 1.09  CALCIUM 9.2 8.4 8.5 8.6  MG  --  1.8  --   --   PHOS  --  2.9  --   --    Liver Function Tests:  Recent Labs Lab 08/27/14 0927 08/30/14 0405  AST 28 10  ALT 15 10  ALKPHOS 138* 84  BILITOT 0.2* <0.2*  PROT 7.0 5.7*  ALBUMIN 2.8* 1.9*    Recent Labs Lab 08/27/14 [phone]  LIPASE 24   No results for input(s): AMMONIA in the  last 168 hours. CBC:  Recent Labs Lab 08/27/14 0719 08/28/14 0606 08/29/14 0314 08/30/14 0405 08/31/14 0435  WBC 23.4* 20.3* 17.5* 13.6* 10.0  NEUTROABS 19.2* 15.3*  --   --   --   HGB 12.6 10.5* 9.5* 9.6* 8.5*  HCT 39.3 33.7* 30.9* 30.6* 27.6*  MCV 99.2 100.6* 102.0* 98.7 99.6  PLT 648* 508* 502* 370 401*   Cardiac Enzymes:  Recent Labs Lab 08/29/14 1730 08/29/14 2220  TROPONINI 0.49* 0.39*   BNP: BNP (last 3 results) No results for input(s): PROBNP in the last 8760 hours. CBG: No results for input(s): GLUCAP in the last 168 hours.  Time coordinating discharge: Over 30 minutes

## 2014-08-31 NOTE — Progress Notes (Signed)
CSW continuing to follow for disposition planning.  CSW reviewed PMT GOC and noted that pt family considering taking pt home with hospice upon discharge.  CSW visited pt room and pt resting at this time. No family present at bedside.   CSW contacted pt daughter, Kendal HymenBonnie via telephone to discuss.  Pt daughter discussed that pt family was considering pt returning home with hospice, but pt family spoke and feel that at this time a better plan is for pt to return to [facility] for continued care. CSW discussed with pt daughter that if pt returns [facility] then full hospice services cannot follow too. Pt daughter expressed understanding and stated that she prefers for pt to return to [facility] and have palliative care services to follow.  CSW contacted [facility] and confirmed facility could accept pt back and that palliative care services can follow at SNF.  CSW discussed with MD and MD plans to assess if pt medically stable for discharge today.  Loletta SpecterSuzanna Kidd, MSW, LCSW Clinical Social Work [phone]

## 2014-08-31 NOTE — Care Management Note (Signed)
CARE MANAGEMENT NOTE 08/31/2014  Patient:  Tami Keith,Tami Keith   Account Number:  [id number]  Date Initiated:  08/31/2014  Documentation initiated by:  Sandford CrazeLEMENTS,Reise Gladney  Subjective/Objective Assessment:   79 yo admitted with Cholecystitis     Action/Plan:   From [facility]   Anticipated DC Date:  08/31/2014   Anticipated DC Plan:  SKILLED NURSING FACILITY  In-house referral  Clinical Social Worker      DC Planning Services  CM consult      Choice offered to / List presented to:             Status of service:  Completed, signed off Medicare Important Message given?   (If response is "NO", the following Medicare IM given date fields will be blank) Date Medicare IM given:   Medicare IM given by:   Date Additional Medicare IM given:   Additional Medicare IM given by:    Discharge Disposition:    Per UR Regulation:  Reviewed for med. necessity/level of care/duration of stay  If discussed at Long Length of Stay Meetings, dates discussed:    Comments:  08/31/14 Sandford CrazeNora Zo Loudon RN,BSN,NCM Chart reviewed.  Pt to dc back to [facility] today.  No CM needs noted.

## 2014-08-31 NOTE — Clinical Documentation Improvement (Signed)
"  Acute on Chronic Systolic and Diastolic Heart Failure" documented in current medical record by Palliative Care service 08/30/14.  Echo done this admission 08/29/14.  Patient has not been treated with with diuretics this admission as of 08/31/14.  Please clarify and the Acuity and Type of Heart Failure monitored and treated this admission:   - Acute on Chronic Systolic and Diastolic Heart Failure, as already documented.   - Chronic Systolic and Diastolic Heart Failure   - Other Acuity and Type of Heart Failure   - Unable to Clinically Determine  Thank You, Jerral Ralphathy R Raisha Brabender ,RN Clinical Documentation Specialist:  [phone] [facility]- Health Information Management

## 2014-08-31 NOTE — Progress Notes (Signed)
     SUBJECTIVE: Says she did not sleep well but denies SOB or chest pain. No palpitations.   BP 110/60 mmHg  Pulse 65  Temp(Src) 97.6 F (36.4 C) (Oral)  Resp 20  Ht 5\' 7"  (1.702 m)  Wt 98 lb 15.8 oz (44.9 kg)  BMI 15.50 kg/m2  SpO2 96%  Intake/Output Summary (Last 24 hours) at 08/31/14 16100609 Last data filed at 08/30/14 1800  Gross per 24 hour  Intake 1124.5 ml  Output      0 ml  Net 1124.5 ml    PHYSICAL EXAM General: Cachectic elderly female. No acute distress. Alert and oriented x 3.  Psych: Good affect, responds appropriately  Neck: No JVD. No masses noted.  Lungs: Clear bilaterally with no wheezes or rhonci noted.  Heart: RRR with no murmurs noted.  Abdomen: Bowel sounds are present. Soft, non-tender.  Extremities: No lower extremity edema.   LABS: Basic Metabolic Panel:  Recent Labs  96/01/5410/15/15 1643 08/29/14 0314 08/30/14 0405  NA 141 137 140  K 4.5 4.0 3.9  CL 106 101 106  CO2 23 23 20   GLUCOSE 98 116* 143*  BUN 24* 19 15  CREATININE 1.29* 1.17* 1.09  CALCIUM 8.4 8.5 8.6  MG 1.8  --   --   PHOS 2.9  --   --    CBC:  Recent Labs  08/30/14 0405 08/31/14 0435  WBC 13.6* 10.0  HGB 9.6* 8.5*  HCT 30.6* 27.6*  MCV 98.7 99.6  PLT 370 401*   Cardiac Enzymes:  Recent Labs  08/29/14 1730 08/29/14 2220  TROPONINI 0.49* 0.39*    Current Meds: . antiseptic oral rinse  7 mL Mouth Rinse q12n4p  . chlorhexidine  15 mL Mouth Rinse BID  . feeding supplement (RESOURCE BREEZE)  1 Container Oral TID BM  . hydrocortisone sod succinate (SOLU-CORTEF) inj  50 mg Intravenous Q6H  . piperacillin-tazobactam (ZOSYN)  IV  3.375 g Intravenous Q8H     ASSESSMENT AND PLAN: 79 yo female admitted with initial concerns for cholecystitis, developed atrial fib with RVR, treated with IV amiodarone and converted to NSR. Also septic shock. She is now off pressors. She is on empiric IV heparin because of suspicion of possible pulmonary emboli as a cause of her initial  presentation. Cardiology consulted for LV systolic dysfunction, LVEF=35-40%. Consult by Dr. Patty SermonsBrackbill 08/29/14. Recurrent atrial fib 08/30/14. Palliative care consult 08/30/14 and pt/family wishes not to escalate care.   1. Cardiomyopathy (? Ischemic)/Troponin elevation: Ejection fraction 35-40% with severe inferior wall hypokinesis. Mild troponin elevation, peak at 0.49, trended down. This could represent ACS although her troponin elevation could be secondary to demand ischemia in setting of septic shock. Family and patient do not wish for aggressive measures. No escalation of care per palliative care consult. No plans for invasive evaluation. If there is a change in this plan, she will need addition of beta blocker and Ace-inh as BP tolerates.   2. Atrial fib, paroxysmal: Appears to be in sinus. Telemetry removed when pt transitioned to comfort care. If the family and pt wishes for any long term medications, would add low dose beta blocker as BP tolerates. Would not plan long term anti-coagulation as the plan now per primary team is for comfort care/palliative care. Consider daily ASA.   Will sign off. Please call with questions.   MCALHANY,CHRISTOPHER  11/18/20156:09 AM

## 2014-08-31 NOTE — Progress Notes (Signed)
Pt for discharge to [facility].  CSW facilitated pt discharge needs including contacting facility, faxing pt discharge information via [facility], discussing with pt and pt daughter at bedside, providing RN phone number to call report, and arranging ambulance transport via [facility].   Pt daughter was nervous about pt transition back to [facility] as pt stomach was bothering her earlier, but pt reports feeling better at this time and pt daughter agreeable to CSW arranging ambulance transport.   No further social work needs identified at this time.  CSW signing off.   Loletta SpecterSuzanna Kidd, MSW, LCSW Clinical Social Work [phone]

## 2014-09-13 ENCOUNTER — Encounter (HOSPITAL_COMMUNITY): Payer: Self-pay | Admitting: *Deleted

## 2014-09-13 ENCOUNTER — Inpatient Hospital Stay (HOSPITAL_COMMUNITY): Payer: Medicare Other

## 2014-09-13 ENCOUNTER — Inpatient Hospital Stay (HOSPITAL_COMMUNITY)
Admission: EM | Admit: 2014-09-13 | Discharge: 2014-09-15 | DRG: 391 | Disposition: A | Discharge status: Skilled Nursing Facility | Payer: Medicare Other | Attending: Internal Medicine | Admitting: Internal Medicine

## 2014-09-13 DIAGNOSIS — Z66 Do not resuscitate: Secondary | ICD-10-CM | POA: Diagnosis present

## 2014-09-13 DIAGNOSIS — I5042 Chronic combined systolic (congestive) and diastolic (congestive) heart failure: Secondary | ICD-10-CM | POA: Diagnosis present

## 2014-09-13 DIAGNOSIS — N179 Acute kidney failure, unspecified: Secondary | ICD-10-CM | POA: Diagnosis present

## 2014-09-13 DIAGNOSIS — I4891 Unspecified atrial fibrillation: Secondary | ICD-10-CM | POA: Diagnosis present

## 2014-09-13 DIAGNOSIS — Z87891 Personal history of nicotine dependence: Secondary | ICD-10-CM

## 2014-09-13 DIAGNOSIS — I429 Cardiomyopathy, unspecified: Secondary | ICD-10-CM | POA: Diagnosis present

## 2014-09-13 DIAGNOSIS — K449 Diaphragmatic hernia without obstruction or gangrene: Secondary | ICD-10-CM | POA: Diagnosis present

## 2014-09-13 DIAGNOSIS — E860 Dehydration: Secondary | ICD-10-CM | POA: Diagnosis present

## 2014-09-13 DIAGNOSIS — E43 Unspecified severe protein-calorie malnutrition: Secondary | ICD-10-CM | POA: Diagnosis present

## 2014-09-13 DIAGNOSIS — I10 Essential (primary) hypertension: Secondary | ICD-10-CM | POA: Diagnosis present

## 2014-09-13 DIAGNOSIS — D539 Nutritional anemia, unspecified: Secondary | ICD-10-CM | POA: Diagnosis present

## 2014-09-13 DIAGNOSIS — N390 Urinary tract infection, site not specified: Secondary | ICD-10-CM | POA: Diagnosis present

## 2014-09-13 DIAGNOSIS — Z90710 Acquired absence of both cervix and uterus: Secondary | ICD-10-CM

## 2014-09-13 DIAGNOSIS — E875 Hyperkalemia: Secondary | ICD-10-CM | POA: Diagnosis present

## 2014-09-13 DIAGNOSIS — N183 Chronic kidney disease, stage 3 (moderate): Secondary | ICD-10-CM | POA: Diagnosis present

## 2014-09-13 DIAGNOSIS — R05 Cough: Secondary | ICD-10-CM

## 2014-09-13 DIAGNOSIS — I129 Hypertensive chronic kidney disease with stage 1 through stage 4 chronic kidney disease, or unspecified chronic kidney disease: Secondary | ICD-10-CM | POA: Diagnosis present

## 2014-09-13 DIAGNOSIS — Z515 Encounter for palliative care: Secondary | ICD-10-CM | POA: Diagnosis not present

## 2014-09-13 DIAGNOSIS — R1310 Dysphagia, unspecified: Secondary | ICD-10-CM

## 2014-09-13 DIAGNOSIS — T18128A Food in esophagus causing other injury, initial encounter: Secondary | ICD-10-CM | POA: Insufficient documentation

## 2014-09-13 DIAGNOSIS — K210 Gastro-esophageal reflux disease with esophagitis: Secondary | ICD-10-CM | POA: Diagnosis present

## 2014-09-13 DIAGNOSIS — R059 Cough, unspecified: Secondary | ICD-10-CM

## 2014-09-13 DIAGNOSIS — Z681 Body mass index (BMI) 19 or less, adult: Secondary | ICD-10-CM | POA: Diagnosis not present

## 2014-09-13 DIAGNOSIS — K222 Esophageal obstruction: Secondary | ICD-10-CM

## 2014-09-13 LAB — CBC WITH DIFFERENTIAL/PLATELET
Basophils Absolute: 0 10*3/uL (ref 0.0–0.1)
Basophils Relative: 0 % (ref 0–1)
Eosinophils Absolute: 0.2 10*3/uL (ref 0.0–0.7)
Eosinophils Relative: 1 % (ref 0–5)
HCT: 32.1 % — ABNORMAL LOW (ref 36.0–46.0)
Hemoglobin: 9.7 g/dL — ABNORMAL LOW (ref 12.0–15.0)
LYMPHS ABS: 3.1 10*3/uL (ref 0.7–4.0)
LYMPHS PCT: 21 % (ref 12–46)
MCH: 31.3 pg (ref 26.0–34.0)
MCHC: 30.2 g/dL (ref 30.0–36.0)
MCV: 103.5 fL — ABNORMAL HIGH (ref 78.0–100.0)
Monocytes Absolute: 1.3 10*3/uL — ABNORMAL HIGH (ref 0.1–1.0)
Monocytes Relative: 9 % (ref 3–12)
NEUTROS ABS: 10 10*3/uL — AB (ref 1.7–7.7)
NEUTROS PCT: 69 % (ref 43–77)
Platelets: 369 10*3/uL (ref 150–400)
RBC: 3.1 MIL/uL — AB (ref 3.87–5.11)
RDW: 15.9 % — ABNORMAL HIGH (ref 11.5–15.5)
WBC: 14.6 10*3/uL — AB (ref 4.0–10.5)

## 2014-09-13 LAB — BASIC METABOLIC PANEL
ANION GAP: 10 (ref 5–15)
BUN: 31 mg/dL — ABNORMAL HIGH (ref 6–23)
CO2: 27 meq/L (ref 19–32)
Calcium: 9.6 mg/dL (ref 8.4–10.5)
Chloride: 101 mEq/L (ref 96–112)
Creatinine, Ser: 1.38 mg/dL — ABNORMAL HIGH (ref 0.50–1.10)
GFR calc Af Amer: 42 mL/min — ABNORMAL LOW (ref >90)
GFR, EST NON AFRICAN AMERICAN: 36 mL/min — AB (ref >90)
GLUCOSE: 104 mg/dL — AB (ref 70–99)
POTASSIUM: 6.7 meq/L — AB (ref 3.7–5.3)
SODIUM: 138 meq/L (ref 137–147)

## 2014-09-13 LAB — POTASSIUM: POTASSIUM: 6.8 meq/L — AB (ref 3.7–5.3)

## 2014-09-13 MED ORDER — ALBUTEROL SULFATE (2.5 MG/3ML) 0.083% IN NEBU
10.0000 mg | INHALATION_SOLUTION | Freq: Once | RESPIRATORY_TRACT | Status: AC
  Administered 2014-09-14: 10 mg via RESPIRATORY_TRACT
  Filled 2014-09-13: qty 12

## 2014-09-13 MED ORDER — INSULIN ASPART 100 UNIT/ML IV SOLN
10.0000 [IU] | Freq: Once | INTRAVENOUS | Status: AC
  Administered 2014-09-14: 10 [IU] via INTRAVENOUS
  Filled 2014-09-13: qty 0.1

## 2014-09-13 MED ORDER — DEXTROSE 50 % IV SOLN
1.0000 | Freq: Once | INTRAVENOUS | Status: AC
  Administered 2014-09-14: 50 mL via INTRAVENOUS
  Filled 2014-09-13: qty 50

## 2014-09-13 NOTE — ED Notes (Signed)
Pt from [facility] home, was eating fish for dinner when she choked on it and started to cough.  Pt reports dysphagia-feels like a piece of the fish is stuck in her throat.

## 2014-09-13 NOTE — ED Provider Notes (Signed)
CSN: [id number]     Arrival date & time 09/13/14  1951 History   First MD Initiated Contact with Patient 09/13/14 2040     Chief Complaint  Patient presents with  . Foreign Body  . Dysphagia   HPI Patient presents to the emergency room with complaints of an esophageal foreign body. The patient has a history of prior issues with esophageal foreign body and stricture. The last time this occurred was many years ago. Patient was eating dinner this evening. She was eating fish and small. Patient felt like the food got stuck in her esophagus. Since 6 PM she has not felt like she can swallow any liquids without having to regurgitate. She denies any other trouble with chest pain fever or shortness of breath. Past Medical History  Diagnosis Date  . Hypertension   . Fracture of pelvis, ischium    Past Surgical History  Procedure Laterality Date  . Appendectomy      as a child   No family history on file. History  Substance Use Topics  . Smoking status: Former Games developer  . Smokeless tobacco: Not on file  . Alcohol Use: No   OB History    No data available     Review of Systems  All other systems reviewed and are negative.     Allergies  Review of patient's allergies indicates no known allergies.  Home Medications   Prior to Admission medications   Medication Sig Start Date End Date Taking? Authorizing Provider  acetaminophen (TYLENOL) 325 MG tablet Take 2 tablets (650 mg total) by mouth every 4 (four) hours as needed for fever, headache or mild pain. 08/31/14  Yes Alison Murray, MD  amoxicillin-clavulanate (AUGMENTIN) 875-125 MG per tablet Take 1 tablet by mouth 2 (two) times daily. 08/31/14  Yes Alison Murray, MD  antiseptic oral rinse (CPC / CETYLPYRIDINIUM CHLORIDE 0.05%) 0.05 % LIQD solution 7 mLs by Mouth Rinse route 2 times daily at 12 noon and 4 pm. 08/31/14  Yes Alison Murray, MD  chlorhexidine (PERIDEX) 0.12 % solution 15 mLs by Mouth Rinse route 2 (two) times daily. 08/31/14   Yes Alison Murray, MD  feeding supplement, RESOURCE BREEZE, (RESOURCE BREEZE) LIQD Take 1 Container by mouth 3 (three) times daily between meals. 08/31/14  Yes Alison Murray, MD  levothyroxine (SYNTHROID, LEVOTHROID) 50 MCG tablet Take 50 mcg by mouth daily before breakfast.   Yes Historical Provider, MD  potassium chloride SA (K-DUR,KLOR-CON) 20 MEQ tablet Take 20 mEq by mouth daily.   Yes Historical Provider, MD  predniSONE (DELTASONE) 5 MG tablet Take 5 mg by mouth daily with breakfast. 7 day therapy course patient has 1 day remaining   Yes Historical Provider, MD  alum & mag hydroxide-simeth (MAALOX/MYLANTA) 200-200-20 MG/5ML suspension Take 30 mLs by mouth every 6 (six) hours as needed for indigestion or heartburn. 08/31/14   Alison Murray, MD  morphine 20 MG/5ML solution Take 0.6 mLs (2.4 mg total) by mouth every 2 (two) hours as needed for pain. 08/31/14   Alison Murray, MD  ondansetron (ZOFRAN) 4 MG tablet Take 1 tablet (4 mg total) by mouth every 6 (six) hours as needed for nausea. 08/31/14   Alison Murray, MD  predniSONE (DELTASONE) 10 MG tablet Take 1 tablet (10 mg total) by mouth 2 (two) times daily with a meal. Patient not taking: Reported on 09/13/2014 08/31/14   Alison Murray, MD   BP 101/66 mmHg  Pulse 76  Temp(Src) 98 F (36.7 C) (Oral)  Resp 26  SpO2 98% Physical Exam  Constitutional: No distress.  Frail, elderly  HENT:  Head: Normocephalic and atraumatic.  Right Ear: External ear normal.  Left Ear: External ear normal.  Eyes: Conjunctivae are normal. Right eye exhibits no discharge. Left eye exhibits no discharge. No scleral icterus.  Neck: Neck supple. No tracheal deviation present.  Cardiovascular: Normal rate, regular rhythm and intact distal pulses.   Pulmonary/Chest: Effort normal and breath sounds normal. No stridor. No respiratory distress. She has no wheezes. She has no rales.  Abdominal: Soft. Bowel sounds are normal. She exhibits no distension. There is no  tenderness. There is no rebound and no guarding.  Musculoskeletal: She exhibits no edema or tenderness.  Neurological: She is alert. She has normal strength. No cranial nerve deficit (no facial droop, extraocular movements intact, no slurred speech) or sensory deficit. She exhibits normal muscle tone. She displays no seizure activity. Coordination normal.  Skin: Skin is warm and dry. No rash noted.  Psychiatric: She has a normal mood and affect.  Nursing note and vitals reviewed.   ED Course  Procedures (including critical care time) CRITICAL CARE Performed by: ZOXWR,UEAKNAPP,Skai Lickteig Total critical care time: 35 Critical care time was exclusive of separately billable procedures and treating other patients. Critical care was necessary to treat or prevent imminent or life-threatening deterioration. Critical care was time spent personally by me on the following activities: development of treatment plan with patient and/or surrogate as well as nursing, discussions with consultants, evaluation of patient's response to treatment, examination of patient, obtaining history from patient or surrogate, ordering and performing treatments and interventions, ordering and review of laboratory studies, ordering and review of radiographic studies, pulse oximetry and re-evaluation of patient's condition.  Labs Review Labs Reviewed  CBC WITH DIFFERENTIAL - Abnormal; Notable for the following:    WBC 14.6 (*)    RBC 3.10 (*)    Hemoglobin 9.7 (*)    HCT 32.1 (*)    MCV 103.5 (*)    RDW 15.9 (*)    Neutro Abs 10.0 (*)    Monocytes Absolute 1.3 (*)    All other components within normal limits  BASIC METABOLIC PANEL - Abnormal; Notable for the following:    Potassium 6.7 (*)    Glucose, Bld 104 (*)    BUN 31 (*)    Creatinine, Ser 1.38 (*)    GFR calc non Af Amer 36 (*)    GFR calc Af Amer 42 (*)    All other components within normal limits  POTASSIUM - Abnormal; Notable for the following:    Potassium 6.8 (*)     All other components within normal limits    Imaging Review No results found.   EKG Interpretation   Date/Time:  Tuesday September 13 2014 22:16:56 EST Ventricular Rate:  73 PR Interval:  151 QRS Duration: 129 QT Interval:  442 QTC Calculation: 487 R Axis:   -88 Text Interpretation:  Sinus rhythm RBBB and LAFB Abnrm T, consider  ischemia, anterolateral lds inferior t wave changes compared to prior ECG  Confirmed by Kendal Raffo  MD-J, Desarai Barrack ([id number]) on 09/13/2014 10:20:09 PM     Medications  insulin aspart (novoLOG) injection 10 Units (not administered)  dextrose 50 % solution 50 mL (not administered)  albuterol (PROVENTIL) (2.5 MG/3ML) 0.083% nebulizer solution 10 mg (not administered)   CRITICAL CARE Performed by: JXBJY,NWGKNAPP,Luken Shadowens Total critical care time: 35 Critical care time was exclusive of separately billable  procedures and treating other patients. Critical care was necessary to treat or prevent imminent or life-threatening deterioration. Critical care was time spent personally by me on the following activities: development of treatment plan with patient and/or surrogate as well as nursing, discussions with consultants, evaluation of patient's response to treatment, examination of patient, obtaining history from patient or surrogate, ordering and performing treatments and interventions, ordering and review of laboratory studies, ordering and review of radiographic studies, pulse oximetry and re-evaluation of patient's condition.  MDM   Final diagnoses:  Hyperkalemia  Food impaction of esophagus, initial encounter    Pt has acute renal insufficiency and hyperkalemia surprisingly as she initially came in for an esophageal food impaction.  The patient is on supplemental oral potassium.  This may be contributing.  Pt is still having trouble swallowing fluids.  I spoke with Dr Rhea BeltonPyrtle.  Pt is not stable for endoscopy right now.  They will evaluate the patient in the hospital.  Disccussed  with Dr Ezzard StandingNewman.  Will admit for further treatment.  Pt treated for her hyperkalemia.      Linwood DibblesJon Kaylyn Garrow, MD 09/13/14 [phone]

## 2014-09-14 ENCOUNTER — Encounter (HOSPITAL_COMMUNITY)
Admission: EM | Disposition: A | Discharge status: Skilled Nursing Facility | Payer: Self-pay | Source: Home / Self Care | Attending: Internal Medicine

## 2014-09-14 ENCOUNTER — Encounter (HOSPITAL_COMMUNITY): Payer: Self-pay

## 2014-09-14 DIAGNOSIS — K222 Esophageal obstruction: Secondary | ICD-10-CM

## 2014-09-14 DIAGNOSIS — I5042 Chronic combined systolic (congestive) and diastolic (congestive) heart failure: Secondary | ICD-10-CM

## 2014-09-14 DIAGNOSIS — R4702 Dysphasia: Secondary | ICD-10-CM

## 2014-09-14 DIAGNOSIS — R1310 Dysphagia, unspecified: Secondary | ICD-10-CM

## 2014-09-14 DIAGNOSIS — E875 Hyperkalemia: Secondary | ICD-10-CM | POA: Diagnosis present

## 2014-09-14 DIAGNOSIS — T18128A Food in esophagus causing other injury, initial encounter: Secondary | ICD-10-CM | POA: Insufficient documentation

## 2014-09-14 DIAGNOSIS — D7289 Other specified disorders of white blood cells: Secondary | ICD-10-CM

## 2014-09-14 DIAGNOSIS — W44F3XA Food entering into or through a natural orifice, initial encounter: Secondary | ICD-10-CM | POA: Insufficient documentation

## 2014-09-14 HISTORY — PX: ESOPHAGOGASTRODUODENOSCOPY: SHX5428

## 2014-09-14 LAB — POTASSIUM: Potassium: 5 mEq/L (ref 3.7–5.3)

## 2014-09-14 LAB — COMPREHENSIVE METABOLIC PANEL
ALBUMIN: 2.7 g/dL — AB (ref 3.5–5.2)
ALT: 13 U/L (ref 0–35)
ALT: 12 U/L (ref 0–35)
ANION GAP: 12 (ref 5–15)
AST: 13 U/L (ref 0–37)
AST: 11 U/L (ref 0–37)
Albumin: 3 g/dL — ABNORMAL LOW (ref 3.5–5.2)
Alkaline Phosphatase: 98 U/L (ref 39–117)
Alkaline Phosphatase: 89 U/L (ref 39–117)
Anion gap: 13 (ref 5–15)
BILIRUBIN TOTAL: 0.4 mg/dL (ref 0.3–1.2)
BUN: 30 mg/dL — ABNORMAL HIGH (ref 6–23)
BUN: 26 mg/dL — ABNORMAL HIGH (ref 6–23)
CHLORIDE: 101 meq/L (ref 96–112)
CHLORIDE: 105 meq/L (ref 96–112)
CO2: 25 meq/L (ref 19–32)
CO2: 24 mEq/L (ref 19–32)
Calcium: 9.5 mg/dL (ref 8.4–10.5)
Calcium: 8.8 mg/dL (ref 8.4–10.5)
Creatinine, Ser: 1.37 mg/dL — ABNORMAL HIGH (ref 0.50–1.10)
Creatinine, Ser: 1.37 mg/dL — ABNORMAL HIGH (ref 0.50–1.10)
GFR calc Af Amer: 42 mL/min — ABNORMAL LOW (ref >90)
GFR calc Af Amer: 42 mL/min — ABNORMAL LOW (ref >90)
GFR calc non Af Amer: 36 mL/min — ABNORMAL LOW (ref >90)
GFR, EST NON AFRICAN AMERICAN: 36 mL/min — AB (ref >90)
Glucose, Bld: 80 mg/dL (ref 70–99)
Glucose, Bld: 88 mg/dL (ref 70–99)
Potassium: 5.5 mEq/L — ABNORMAL HIGH (ref 3.7–5.3)
Potassium: 6.5 mEq/L (ref 3.7–5.3)
SODIUM: 139 meq/L (ref 137–147)
Sodium: 141 mEq/L (ref 137–147)
TOTAL PROTEIN: 7.2 g/dL (ref 6.0–8.3)
Total Bilirubin: 0.3 mg/dL (ref 0.3–1.2)
Total Protein: 6.3 g/dL (ref 6.0–8.3)

## 2014-09-14 LAB — CBC WITH DIFFERENTIAL/PLATELET
Basophils Absolute: 0 10*3/uL (ref 0.0–0.1)
Basophils Relative: 0 % (ref 0–1)
Eosinophils Absolute: 0.2 10*3/uL (ref 0.0–0.7)
Eosinophils Relative: 2 % (ref 0–5)
HEMATOCRIT: 32 % — AB (ref 36.0–46.0)
Hemoglobin: 9.8 g/dL — ABNORMAL LOW (ref 12.0–15.0)
LYMPHS PCT: 28 % (ref 12–46)
Lymphs Abs: 3.9 10*3/uL (ref 0.7–4.0)
MCH: 31.3 pg (ref 26.0–34.0)
MCHC: 30.6 g/dL (ref 30.0–36.0)
MCV: 102.2 fL — ABNORMAL HIGH (ref 78.0–100.0)
Monocytes Absolute: 1.3 10*3/uL — ABNORMAL HIGH (ref 0.1–1.0)
Monocytes Relative: 9 % (ref 3–12)
NEUTROS ABS: 8.6 10*3/uL — AB (ref 1.7–7.7)
Neutrophils Relative %: 61 % (ref 43–77)
PLATELETS: 356 10*3/uL (ref 150–400)
RBC: 3.13 MIL/uL — ABNORMAL LOW (ref 3.87–5.11)
RDW: 16 % — ABNORMAL HIGH (ref 11.5–15.5)
WBC: 14.1 10*3/uL — ABNORMAL HIGH (ref 4.0–10.5)

## 2014-09-14 LAB — MRSA PCR SCREENING: MRSA by PCR: NEGATIVE

## 2014-09-14 LAB — TROPONIN I

## 2014-09-14 LAB — GLUCOSE, CAPILLARY: GLUCOSE-CAPILLARY: 83 mg/dL (ref 70–99)

## 2014-09-14 SURGERY — EGD (ESOPHAGOGASTRODUODENOSCOPY)
Anesthesia: Moderate Sedation

## 2014-09-14 MED ORDER — CHLORHEXIDINE GLUCONATE 0.12 % MT SOLN: 15.0000 mL | Freq: Two times a day (BID) | OROMUCOSAL | Status: DC

## 2014-09-14 MED ORDER — LEVOTHYROXINE SODIUM 50 MCG PO TABS
50.0000 ug | ORAL_TABLET | Freq: Every day | ORAL | Status: DC
  Filled 2014-09-14 (×2): qty 1

## 2014-09-14 MED ORDER — ALBUTEROL SULFATE (2.5 MG/3ML) 0.083% IN NEBU: 2.5000 mg | INHALATION_SOLUTION | Freq: Four times a day (QID) | RESPIRATORY_TRACT | Status: DC | PRN

## 2014-09-14 MED ORDER — DEXTROSE 50 % IV SOLN
2.0000 | Freq: Once | INTRAVENOUS | Status: AC
Start: 2014-09-14 — End: 2014-09-14
  Administered 2014-09-14: 100 mL via INTRAVENOUS
  Filled 2014-09-14: qty 100

## 2014-09-14 MED ORDER — SODIUM POLYSTYRENE SULFONATE 15 GM/60ML PO SUSP
60.0000 g | Freq: Once | ORAL | Status: DC
  Filled 2014-09-14: qty 240

## 2014-09-14 MED ORDER — CETYLPYRIDINIUM CHLORIDE 0.05 % MT LIQD
7.0000 mL | Freq: Two times a day (BID) | OROMUCOSAL | Status: DC
  Administered 2014-09-14 – 2014-09-15 (×3): 7 mL via OROMUCOSAL

## 2014-09-14 MED ORDER — MIDAZOLAM HCL 10 MG/2ML IJ SOLN
INTRAMUSCULAR | Status: DC | PRN
  Administered 2014-09-14: 2 mg via INTRAVENOUS

## 2014-09-14 MED ORDER — LEVOTHYROXINE SODIUM 100 MCG IV SOLR
25.0000 ug | Freq: Every day | INTRAVENOUS | Status: DC
  Filled 2014-09-14 (×2): qty 5

## 2014-09-14 MED ORDER — HEPARIN SODIUM (PORCINE) 5000 UNIT/ML IJ SOLN
5000.0000 [IU] | Freq: Three times a day (TID) | INTRAMUSCULAR | Status: DC
  Administered 2014-09-14: 5000 [IU] via SUBCUTANEOUS
  Filled 2014-09-14 (×5): qty 1

## 2014-09-14 MED ORDER — FENTANYL CITRATE 0.05 MG/ML IJ SOLN
INTRAMUSCULAR | Status: AC
  Filled 2014-09-14: qty 2

## 2014-09-14 MED ORDER — INSULIN ASPART 100 UNIT/ML IV SOLN
10.0000 [IU] | Freq: Once | INTRAVENOUS | Status: AC
  Administered 2014-09-14: 10 [IU] via INTRAVENOUS

## 2014-09-14 MED ORDER — INSULIN ASPART 100 UNIT/ML IV SOLN: 10.0000 [IU] | Freq: Once | INTRAVENOUS | Status: DC

## 2014-09-14 MED ORDER — MIDAZOLAM HCL 10 MG/2ML IJ SOLN
INTRAMUSCULAR | Status: AC
  Filled 2014-09-14: qty 2

## 2014-09-14 MED ORDER — SODIUM POLYSTYRENE SULFONATE 15 GM/60ML PO SUSP
30.0000 g | Freq: Once | ORAL | Status: AC
Start: 2014-09-14 — End: 2014-09-14
  Administered 2014-09-14: 30 g via ORAL
  Filled 2014-09-14: qty 120

## 2014-09-14 MED ORDER — SODIUM CHLORIDE 0.9 % IV SOLN
INTRAVENOUS | Status: DC
  Administered 2014-09-14 – 2014-09-15 (×2): via INTRAVENOUS

## 2014-09-14 MED ORDER — ACETAMINOPHEN 10 MG/ML IV SOLN
1000.0000 mg | Freq: Three times a day (TID) | INTRAVENOUS | Status: AC | PRN
  Administered 2014-09-14: 1000 mg via INTRAVENOUS
  Filled 2014-09-14 (×2): qty 100

## 2014-09-14 MED ORDER — FENTANYL CITRATE 0.05 MG/ML IJ SOLN
INTRAMUSCULAR | Status: DC | PRN
  Administered 2014-09-14: 25 ug via INTRAVENOUS
  Administered 2014-09-14: 12.5 ug via INTRAVENOUS

## 2014-09-14 MED ORDER — ALBUTEROL SULFATE (2.5 MG/3ML) 0.083% IN NEBU
2.5000 mg | INHALATION_SOLUTION | Freq: Once | RESPIRATORY_TRACT | Status: DC
  Filled 2014-09-14: qty 3

## 2014-09-14 MED ORDER — PANTOPRAZOLE SODIUM 40 MG PO TBEC
40.0000 mg | DELAYED_RELEASE_TABLET | Freq: Two times a day (BID) | ORAL | Status: DC
  Administered 2014-09-14 – 2014-09-15 (×2): 40 mg via ORAL
  Filled 2014-09-14 (×5): qty 1

## 2014-09-14 MED ORDER — ONDANSETRON HCL 4 MG PO TABS: 4.0000 mg | ORAL_TABLET | Freq: Four times a day (QID) | ORAL | Status: DC | PRN

## 2014-09-14 MED ORDER — CHLORHEXIDINE GLUCONATE 0.12 % MT SOLN
15.0000 mL | Freq: Two times a day (BID) | OROMUCOSAL | Status: DC
Start: 2014-09-14 — End: 2014-09-15
  Administered 2014-09-14 – 2014-09-15 (×3): 15 mL via OROMUCOSAL
  Filled 2014-09-14 (×5): qty 15

## 2014-09-14 MED ORDER — CEFTRIAXONE SODIUM IN DEXTROSE 20 MG/ML IV SOLN
1.0000 g | INTRAVENOUS | Status: DC
  Administered 2014-09-15: 1 g via INTRAVENOUS
  Filled 2014-09-14 (×2): qty 50

## 2014-09-14 MED ORDER — ACETAMINOPHEN 325 MG PO TABS: 650.0000 mg | ORAL_TABLET | ORAL | Status: DC | PRN

## 2014-09-14 MED ORDER — SODIUM POLYSTYRENE SULFONATE 15 GM/60ML PO SUSP
60.0000 g | Freq: Once | ORAL | Status: AC
  Administered 2014-09-14: 60 g via RECTAL
  Filled 2014-09-14: qty 240

## 2014-09-14 MED ORDER — CETYLPYRIDINIUM CHLORIDE 0.05 % MT LIQD: 7.0000 mL | Freq: Two times a day (BID) | OROMUCOSAL | Status: DC

## 2014-09-14 MED ORDER — MORPHINE SULFATE 10 MG/5ML PO SOLN: 2.5000 mg | ORAL | Status: DC | PRN

## 2014-09-14 NOTE — Progress Notes (Signed)
Patient seen and examined. Admitted after midnight secondary to dysphagia/difficulty swallowing and concerns for food impaction. Patient also found with hyperkalemia and mild acute on chronic renal failure. Patient denies fever, chills, SOB, coughing spells and diarrhea. CXR demonstrated no PNA. At SNF was receiving treatment for UTI. Please referred to H&P written by Dr. Alvester MorinNewton for further info/details on admission.  Plan: -keep NPO -GI has seen patient and most likely will perform EGD (hopefully later today) -medications changed to IV meanwhile -follow clinical response.  Vassie LollMadera, Tami Keith [phone]

## 2014-09-14 NOTE — H&P (Addendum)
Hospitalist Admission History and Physical  Patient name: Tami Keith Medical record number: [id number] Date of birth: [birth year] Age: 79 y.o. Gender: female  Primary Care Provider: Olivia Mackieho, William, MD  Chief Complaint: hyperkalemia, dysphagia  History of Present Illness:This is a 79 y.o. year old female with significant past medical history of HTN, pelvic fracture, combined chronic systolic and diastolic congestive heart failure presenting with hyperkalemia, dysphagia. Pt noted to have been recently admitted for sepsis 2/2 cholecystitis 11/14-11/18. Was discharged w/ palliative care and SNF. Per report, pt w/ dysphagia over past 2-3 days. Has progressively worsened to point where pt can neither drink nor eat. Family believes pt may have piece of fish/fish bone stuck in throat.  Presented to ER hemodynamically stable. Afebrile. Had incidental finding of K 6.7. 6.8 on recheck. WBC 14.6, hgb 9.7, Cr 1.38. EKG w/ no peaked T waves, though with noted T wave inversions in lateral leads-worse in comparison to previous. No CP per pt. EDP Lynelle Doctor([place]), discussed case w/ GI (Pyrtle). They will consult in am. Defer endoscopy until resolution of hyperkalemia.   Assessment and Plan: Tami GhaziVera Bohanon is a 79 y.o. year old female presenting with hyperkalemia, Dysphagia   Active Problems:   UTI (lower urinary tract infection)   Hyperkalemia   Dysphagia   1- Hyperkalemia  -on oral K at SNF-HOLD -likely nidus in setting of AoCKI, dehydration  -rectal kayexalate -serial CMETs -no peaked T waves on EKG  -follow   2- Dysphagia -NPO  -pending GI consult in am -follow   3-Leukocytosis -recurrent issue -noted to be in prednisone outpt  -afebrile -CXR and UA pending -blood and urine cxs -f/u imaging.   4- Combined systolic and diastolic congestive heart failure/Hx/o afib  -dry on exam  -gently hydrate -NSR on EKG, though w/ inverted t waves -trop x1 -no active CP  -tele bed  -follow   5- AoCKI   -dry on exam  -gently hydrate  -follow   FEN/GI: NPO  Prophylaxis: sub q heparin  Disposition: pending further evaluation  Code Status:DNR-plugged in w/ palliative care at SNF    Patient Active Problem List   Diagnosis Date Noted  . Hyperkalemia 09/14/2014  . Dysphagia 09/14/2014  . UTI (lower urinary tract infection) 09/13/2014  . Cardiomyopathy of undetermined type 08/30/2014  . Cardiogenic shock   . Acute combined systolic and diastolic congestive heart failure   . Shock   . Septic shock 08/28/2014  . Atrial fibrillation with rapid ventricular response 08/28/2014  . Cholecystitis 08/27/2014  . Acute cholecystitis 08/27/2014  . Essential hypertension 08/27/2014  . Leukocytosis 08/27/2014   Past Medical History: Past Medical History  Diagnosis Date  . Hypertension   . Fracture of pelvis, ischium     Past Surgical History: Past Surgical History  Procedure Laterality Date  . Appendectomy      as a child    Social History: History   Social History  . Marital Status: Widowed    Spouse Name: N/A    Number of Children: N/A  . Years of Education: N/A   Social History Main Topics  . Smoking status: Former Games developermoker  . Smokeless tobacco: None  . Alcohol Use: No  . Drug Use: No  . Sexual Activity: No   Other Topics Concern  . None   Social History Narrative    Family History: No family history on file.  Allergies: No Known Allergies  Current Facility-Administered Medications  Medication Dose Route Frequency Provider Last Rate Last Dose  . 0.9 %  sodium chloride infusion   Intravenous Continuous Doree Albee, MD      . acetaminophen (TYLENOL) tablet 650 mg  650 mg Oral Q4H PRN Doree Albee, MD      . albuterol (PROVENTIL) (2.5 MG/3ML) 0.083% nebulizer solution 10 mg  10 mg Nebulization Once Linwood Dibbles, MD      . antiseptic oral rinse (CPC / CETYLPYRIDINIUM CHLORIDE 0.05%) solution 7 mL  7 mL Mouth Rinse q12n4p Doree Albee, MD      . chlorhexidine  (PERIDEX) 0.12 % solution 15 mL  15 mL Mouth Rinse BID Doree Albee, MD      . dextrose 50 % solution 50 mL  1 ampule Intravenous Once Linwood Dibbles, MD      . heparin injection 5,000 Units  5,000 Units Subcutaneous 3 times per day Doree Albee, MD      . insulin aspart (novoLOG) injection 10 Units  10 Units Intravenous Once Linwood Dibbles, MD      . levothyroxine (SYNTHROID, LEVOTHROID) tablet 50 mcg  50 mcg Oral QAC breakfast Doree Albee, MD      . morphine 20 MG/5ML solution 2.5 mg  2.5 mg Oral Q2H PRN Doree Albee, MD      . ondansetron [facility]) tablet 4 mg  4 mg Oral Q6H PRN Doree Albee, MD      . sodium polystyrene (KAYEXALATE) 15 GM/60ML suspension 60 g  60 g Rectal Once Doree Albee, MD       Current Outpatient Prescriptions  Medication Sig Dispense Refill  . acetaminophen (TYLENOL) 325 MG tablet Take 2 tablets (650 mg total) by mouth every 4 (four) hours as needed for fever, headache or mild pain. 30 tablet 0  . amoxicillin-clavulanate (AUGMENTIN) 875-125 MG per tablet Take 1 tablet by mouth 2 (two) times daily. 28 tablet 0  . antiseptic oral rinse (CPC / CETYLPYRIDINIUM CHLORIDE 0.05%) 0.05 % LIQD solution 7 mLs by Mouth Rinse route 2 times daily at 12 noon and 4 pm. 44 mL 0  . chlorhexidine (PERIDEX) 0.12 % solution 15 mLs by Mouth Rinse route 2 (two) times daily. 120 mL 0  . feeding supplement, RESOURCE BREEZE, (RESOURCE BREEZE) LIQD Take 1 Container by mouth 3 (three) times daily between meals. 237 mL 0  . levothyroxine (SYNTHROID, LEVOTHROID) 50 MCG tablet Take 50 mcg by mouth daily before breakfast.    . potassium chloride SA (K-DUR,KLOR-CON) 20 MEQ tablet Take 20 mEq by mouth daily.    . predniSONE (DELTASONE) 5 MG tablet Take 5 mg by mouth daily with breakfast. 7 day therapy course patient has 1 day remaining    . alum & mag hydroxide-simeth (MAALOX/MYLANTA) 200-200-20 MG/5ML suspension Take 30 mLs by mouth every 6 (six) hours as needed for indigestion or heartburn. 355 mL 0  .  morphine 20 MG/5ML solution Take 0.6 mLs (2.4 mg total) by mouth every 2 (two) hours as needed for pain. 30 mL 0  . ondansetron (ZOFRAN) 4 MG tablet Take 1 tablet (4 mg total) by mouth every 6 (six) hours as needed for nausea. 20 tablet 0  . predniSONE (DELTASONE) 10 MG tablet Take 1 tablet (10 mg total) by mouth 2 (two) times daily with a meal. (Patient not taking: Reported on 09/13/2014) 14 tablet 0   Review Of Systems: 12 point ROS negative except as noted above in HPI.  Physical Exam: Filed Vitals:   09/13/14 2234  BP: 101/66  Pulse: 76  Temp:   Resp: 26    General: cooperative and  cachectic HEENT: PERRLA, extra ocular movement intact and dry oral mucosa Heart: S1, S2 normal, no murmur, rub or gallop, regular rate and rhythm Lungs: clear to auscultation, no wheezes or rales and unlabored breathing Abdomen: abdomen is soft without significant tenderness, masses, organomegaly or guarding Extremities: extremities normal, atraumatic, no cyanosis or edema Skin:no rashes, no ecchymoses Neurology: normal without focal findings  Labs and Imaging: Lab Results  Component Value Date/Time   NA 138 09/13/2014 09:09 PM   K 6.8* 09/13/2014 10:21 PM   CL 101 09/13/2014 09:09 PM   CO2 27 09/13/2014 09:09 PM   BUN 31* 09/13/2014 09:09 PM   CREATININE 1.38* 09/13/2014 09:09 PM   GLUCOSE 104* 09/13/2014 09:09 PM   Lab Results  Component Value Date   WBC 14.6* 09/13/2014   HGB 9.7* 09/13/2014   HCT 32.1* 09/13/2014   MCV 103.5* 09/13/2014   PLT 369 09/13/2014    No results found.         Doree AlbeeSteven Telvin Reinders MD  Pager: [phone]

## 2014-09-14 NOTE — Interval H&P Note (Signed)
History and Physical Interval Note:  09/14/2014 12:54 PM  Tami Keith  has presented today for surgery, with the diagnosis of food impactioni  The various methods of treatment have been discussed with the patient and family. After consideration of risks, benefits and other options for treatment, the patient has consented to  Procedure(s) with comments: ESOPHAGOGASTRODUODENOSCOPY (EGD) (N/A) - possible food impaction as a surgical intervention .  The patient's history has been reviewed, patient examined, no change in status, stable for surgery.  I have reviewed the patient's chart and labs.  Questions were answered to the patient's satisfaction.     Venita LickMalcolm T. Russella DarStark MD

## 2014-09-14 NOTE — Op Note (Signed)
[facility] [address] [place], [id number]   ENDOSCOPY PROCEDURE REPORT  PATIENT: Tami Keith, [name]  MR#: [id number] BIRTHDATE: 02/26/1935 , 79  yrs. old GENDER: female ENDOSCOPIST: Meryl DareMalcolm T Keesha Pellum, MD, [facility] REFERRED BY:  [facility] PROCEDURE DATE:  09/14/2014 PROCEDURE:  EGD w/ fb removal and EGD w/ balloon dilation ASA CLASS:     Class III INDICATIONS:  dysphagia. MEDICATIONS: Fentanyl 37.5 mcg IV and Versed 3 mg IV TOPICAL ANESTHETIC: none DESCRIPTION OF PROCEDURE: After the risks benefits and alternatives of the procedure were thoroughly explained, informed consent was obtained.  The Pentax Gastroscope [id number] endoscope was introduced through the mouth and advanced to the second portion of the duodenum , Without limitations.  The instrument was slowly withdrawn as the mucosa was fully examined.  ESOPHAGUS: There was residual food seen in the distal esophagus. The food was easily and gently advanced into the stomach.   There was LA Class D esophagitis (Mucosal breaks involving more than 75% of esophageal circumference) noted.   There was a short fibrotic and benign appearing stricture, with an inner diameter of 11 mm, at the gastroesophageal junction.  The stricture was easily traversable. Using a TTS-balloon the stricture was dilated 12, 13.5 and 15 mm. The balloon was held inflated for 1 minutes.  A small mucocal rent was noted.  The remainder of the esophagus was unremarkable. STOMACH: The mucosa of the stomach appeared normal with a large hiatal hernia. DUODENUM: The duodenal mucosa showed no abnormalities in the bulb and 2nd part of the duodenum.  Retroflexed views revealed a 7 cm hiatal hernia.    The scope was then withdrawn from the patient and the procedure completed.  COMPLICATIONS: There were no immediate complications.  ENDOSCOPIC IMPRESSION: 1.   Residual food in the distal esophagus; removed 2.   LA Class D esophagitis 3.    Stricture at the gastroesophageal junction;  TTS-balloon dilation performed 4.   Large hiatal hernia  RECOMMENDATIONS: 1.  Anti-reflux regimen long term 2.  PPI bid long term 3.  Post dilation instructions 4.  OK for discharge today from GI standpoint. GI follow up as needed.  eSigned:  Meryl DareMalcolm T Dereke Neumann, MD, [facility] 09/14/2014 1:24 PM

## 2014-09-14 NOTE — Consult Note (Signed)
Consultation  Referring Provider: [facility]     Primary Care Physician:  Olivia Mackieho, William, MD Primary Gastroenterologist: She is unsure     Reason for Consultation: dysphagia / ? Food impaction             HPI:   Tami Keith is a 79 y.o. female who was hospitalized earlier this month for abdominal pain / nausea / vomiting. She was found to have cholecystitis, ultimately went into septic shock. Hospital course was complicated by new onset afib with RVR.Marland Kitchen. Condition improved with treatment and decision was made to focus on comfort care. Patient was discharged to SNF.  Patient presented to ED last night with complaints of dysphagia. A pill became lodged in her esophagus last week. Pill eventually passed but then yesterday a piece of fish wouldn't pass. No significant odynophagia. Per daughter, patient [place].  She has done fairly well from a swallowing standpoint until now.  Of note, a large hiatal hernia present on CTscan mid November. No chronic GI complaints. Specifically, no bowel changes, blood in stool or recent abdominal pain .  Past Medical History  Diagnosis Date  . Hypertension   . Fracture of pelvis, ischium     Past Surgical History  Procedure Laterality Date  . Appendectomy      as a child     FMH: No colon cancer, or other GI illnesses  History  Substance Use Topics  . Smoking status: Former Games developermoker  . Smokeless tobacco: Not on file  . Alcohol Use: No    Prior to Admission medications   Medication Sig Start Date End Date Taking? Authorizing Provider  acetaminophen (TYLENOL) 325 MG tablet Take 2 tablets (650 mg total) by mouth every 4 (four) hours as needed for fever, headache or mild pain. 08/31/14  Yes Alison MurrayAlma M Devine, MD  amoxicillin-clavulanate (AUGMENTIN) 875-125 MG per tablet Take 1 tablet by mouth 2 (two) times daily. 08/31/14  Yes Alison MurrayAlma M Devine, MD  antiseptic oral rinse (CPC / CETYLPYRIDINIUM  CHLORIDE 0.05%) 0.05 % LIQD solution 7 mLs by Mouth Rinse route 2 times daily at 12 noon and 4 pm. 08/31/14  Yes Alison MurrayAlma M Devine, MD  chlorhexidine (PERIDEX) 0.12 % solution 15 mLs by Mouth Rinse route 2 (two) times daily. 08/31/14  Yes Alison MurrayAlma M Devine, MD  feeding supplement, RESOURCE BREEZE, (RESOURCE BREEZE) LIQD Take 1 Container by mouth 3 (three) times daily between meals. 08/31/14  Yes Alison MurrayAlma M Devine, MD  levothyroxine (SYNTHROID, LEVOTHROID) 50 MCG tablet Take 50 mcg by mouth daily before breakfast.   Yes Historical Provider, MD  potassium chloride SA (K-DUR,KLOR-CON) 20 MEQ tablet Take 20 mEq by mouth daily.   Yes Historical Provider, MD  predniSONE (DELTASONE) 5 MG tablet Take 5 mg by mouth daily with breakfast. 7 day therapy course patient has 1 day remaining   Yes Historical Provider, MD  alum & mag hydroxide-simeth (MAALOX/MYLANTA) 200-200-20 MG/5ML suspension Take 30 mLs by mouth every 6 (six) hours as needed for indigestion or heartburn. 08/31/14   Alison MurrayAlma M Devine, MD  morphine 20 MG/5ML solution Take 0.6 mLs (2.4 mg total) by mouth every 2 (two) hours as needed for pain. 08/31/14   Alison MurrayAlma M Devine, MD  ondansetron (ZOFRAN) 4 MG tablet Take 1 tablet (4 mg total) by mouth every 6 (six) hours as needed for nausea. 08/31/14   Alison MurrayAlma M Devine, MD  predniSONE (DELTASONE) 10 MG tablet Take  1 tablet (10 mg total) by mouth 2 (two) times daily with a meal. Patient not taking: Reported on 09/13/2014 08/31/14   Alison Murray, MD    Current Facility-Administered Medications  Medication Dose Route Frequency Provider Last Rate Last Dose  . 0.9 %  sodium chloride infusion   Intravenous Continuous Doree Albee, MD 75 mL/hr at 09/14/14 0029    . acetaminophen (OFIRMEV) IV 1,000 mg  1,000 mg Intravenous Q8H PRN Vassie Loll, MD   1,000 mg at 09/14/14 0929  . antiseptic oral rinse (CPC / CETYLPYRIDINIUM CHLORIDE 0.05%) solution 7 mL  7 mL Mouth Rinse q12n4p Doree Albee, MD   7 mL at 09/14/14 1200  .  chlorhexidine (PERIDEX) 0.12 % solution 15 mL  15 mL Mouth Rinse BID Doree Albee, MD   15 mL at 09/14/14 0554  . heparin injection 5,000 Units  5,000 Units Subcutaneous 3 times per day Doree Albee, MD   5,000 Units at 09/14/14 0554  . levothyroxine (SYNTHROID, LEVOTHROID) tablet 50 mcg  50 mcg Oral QAC breakfast Doree Albee, MD   50 mcg at 09/14/14 0800  . morphine 10 MG/5ML solution 2.5 mg  2.5 mg Oral Q2H PRN Doree Albee, MD      . ondansetron [facility]) tablet 4 mg  4 mg Oral Q6H PRN Doree Albee, MD      . sodium polystyrene (KAYEXALATE) 15 GM/60ML suspension 60 g  60 g Rectal Once Doree Albee, MD   60 g at 09/14/14 0027    Allergies as of 09/13/2014  . (No Known Allergies)     Review of Systems:    All systems reviewed and negative except where noted in HPI.    Physical Exam:  Vital signs in last 24 hours: Temp:  [98 F (36.7 C)-98.9 F (37.2 C)] 98.9 F (37.2 C) (12/02 0550) Pulse Rate:  [73-112] 80 (12/02 0550) Resp:  [15-27] 24 (12/02 0550) BP: (101-118)/(55-67) 101/55 mmHg (12/02 0550) SpO2:  [96 %-100 %] 96 % (12/02 0550) Weight:  [82 lb 3.7 oz (37.3 kg)] 82 lb 3.7 oz (37.3 kg) (12/02 0124) Last BM Date: 09/13/14 General:   Pleasant, thin white female in NAD Head:  Normocephalic and atraumatic. Eyes:   No icterus.   Conjunctiva pink. Ears:  Normal auditory acuity. Neck:  Supple; no masses felt Lungs:  Respirations even and unlabored. Lungs clear to auscultation bilaterally.   No wheezes, crackles, or rhonchi.  Heart:  Regular rate and rhythm; Abdomen:  Soft, nondistended, nontender. Normal bowel sounds. No appreciable masses or hepatomegaly.  Rectal:  Not performed.  Msk:  Symmetrical without gross deformities.  Extremities:  Without edema. Neurologic:  Alert and  oriented x4;  grossly normal neurologically. Skin:  Intact without significant lesions or rashes. Cervical Nodes:  No significant cervical adenopathy. Psych:  Alert and cooperative. Normal  affect.  LAB RESULTS:  Recent Labs  09/13/14 2109 09/14/14 0203  WBC 14.6* 14.1*  HGB 9.7* 9.8*  HCT 32.1* 32.0*  PLT 369 356   BMET  Recent Labs  09/13/14 2109 09/13/14 2221 09/14/14 0203  NA 138  --  139  K 6.7* 6.8* 5.5*  CL 101  --  101  CO2 27  --  25  GLUCOSE 104*  --  80  BUN 31*  --  30*  CREATININE 1.38*  --  1.37*  CALCIUM 9.6  --  9.5   LFT  Recent Labs  09/14/14 0203  PROT 7.2  ALBUMIN 3.0*  AST 13  ALT 13  ALKPHOS 98  BILITOT 0.3    STUDIES: Dg Chest 2 View  09/14/2014   CLINICAL DATA:  Cough and solid food dysphagia.  Initial encounter.  EXAM: CHEST  2 VIEW  COMPARISON:  08/27/2014 and prior chest radiographs  FINDINGS: Cardiomegaly and large hiatal hernia again noted.  There is no evidence of focal airspace disease, pulmonary edema, suspicious pulmonary nodule/mass, pleural effusion, or pneumothorax. No acute bony abnormalities are identified.  Severe degenerative changes in the left shoulder again noted as well as remote left rib fractures.  IMPRESSION: No evidence of acute cardiopulmonary disease.  Cardiomegaly and large hiatal hernia.   Electronically Signed   By: Laveda AbbeJeff  Hu M.D.   On: 09/14/2014 00:42    PREVIOUS ENDOSCOPIES:            Patient reports EGD with dilation [place]  Patient is unsure about whether she has ever had a colonoscopy   Impression / Plan:   11. 79 year old female admitted with possible food impaction (boneless fish). EGD not done last night secondary to hyperkalemia. Potassium improved (5.5 today). Patient and daughter agreeable to proceeding with EGD today. The benefits, risks, and potential complications of EGD with possible dilation were discussed with the patient and she agrees to proceed.   2. Hyperkalemia, improved  3. Macrocytic anemia. No overt bleeding.   4. Acute (?on chronic) renal insufficieny.  Thanks   LOS: 1 day   Willette Clusteraula Guenther  09/14/2014, 10:39 AM     Attending  physician's note   I have taken a history, examined the patient and reviewed the chart. I agree with the Advanced Practitioner's note, impression and recommendations.  Meryl DareMalcolm T Lennis Korb, MD Clementeen GrahamFACG

## 2014-09-14 NOTE — Progress Notes (Signed)
Clinical Social Work Department BRIEF PSYCHOSOCIAL ASSESSMENT 09/14/2014  Patient:  Tami Keith,Tami Keith     Account Number:  [id number]     Admit date:  09/13/2014  Clinical Social Worker:  Orpah GreekFOLEY,Lainey Nelson, LCSWA  Date/Time:  09/14/2014 02:26 PM  Referred by:  Physician  Date Referred:  09/14/2014 Referred for  Other - See comment   Other Referral:   Admitted from: [facility]   Interview type:  Patient Other interview type:    PSYCHOSOCIAL DATA Living Status:  FACILITY Admitted from facility:  [facility] Level of care:  Skilled Nursing Facility Primary support name:  Tami Keith (daughter) ph#: [phone] Primary support relationship to patient:  CHILD, ADULT Degree of support available:   good    CURRENT CONCERNS Current Concerns  Post-Acute Placement   Other Concerns:    SOCIAL WORK ASSESSMENT / PLAN CSW received consult that patient was admitted from [facility].   Assessment/plan status:  Information/Referral to [facility] Other assessment/ plan:   Information/referral to community resources:   CSW completed FL2 and faxed information to [facility], [facility] would be able to take patient back when stable.    PATIENT'S/FAMILY'S RESPONSE TO PLAN OF CARE: Patient's daughter, Tami Keith confirmed that patient is to return to [facility].          Tami MaxinKelly Edina Winningham, LCSW [facility] Clinical Social Worker cell #: [phone]

## 2014-09-14 NOTE — H&P (View-Only) (Signed)
Consultation  Referring Provider: [facility]     Primary Care Physician:  Olivia Mackieho, William, MD Primary Gastroenterologist: She is unsure     Reason for Consultation: dysphagia / ? Food impaction             HPI:   Tami Keith is a 79 y.o. female who was hospitalized earlier this month for abdominal pain / nausea / vomiting. She was found to have cholecystitis, ultimately went into septic shock. Hospital course was complicated by new onset afib with RVR.Marland Kitchen. Condition improved with treatment and decision was made to focus on comfort care. Patient was discharged to SNF.  Patient presented to ED last night with complaints of dysphagia. A pill became lodged in her esophagus last week. Pill eventually passed but then yesterday a piece of fish wouldn't pass. No significant odynophagia. Per daughter, patient had her esophagus dilated [place].  She has done fairly well from a swallowing standpoint until now.  Of note, a large hiatal hernia present on CTscan mid November. No chronic GI complaints. Specifically, no bowel changes, blood in stool or recent abdominal pain .  Past Medical History  Diagnosis Date  . Hypertension   . Fracture of pelvis, ischium     Past Surgical History  Procedure Laterality Date  . Appendectomy      as a child     FMH: No colon cancer, or other GI illnesses  History  Substance Use Topics  . Smoking status: Former Games developermoker  . Smokeless tobacco: Not on file  . Alcohol Use: No    Prior to Admission medications   Medication Sig Start Date End Date Taking? Authorizing Provider  acetaminophen (TYLENOL) 325 MG tablet Take 2 tablets (650 mg total) by mouth every 4 (four) hours as needed for fever, headache or mild pain. 08/31/14  Yes Alison MurrayAlma M Devine, MD  amoxicillin-clavulanate (AUGMENTIN) 875-125 MG per tablet Take 1 tablet by mouth 2 (two) times daily. 08/31/14  Yes Alison MurrayAlma M Devine, MD  antiseptic oral rinse (CPC / CETYLPYRIDINIUM  CHLORIDE 0.05%) 0.05 % LIQD solution 7 mLs by Mouth Rinse route 2 times daily at 12 noon and 4 pm. 08/31/14  Yes Alison MurrayAlma M Devine, MD  chlorhexidine (PERIDEX) 0.12 % solution 15 mLs by Mouth Rinse route 2 (two) times daily. 08/31/14  Yes Alison MurrayAlma M Devine, MD  feeding supplement, RESOURCE BREEZE, (RESOURCE BREEZE) LIQD Take 1 Container by mouth 3 (three) times daily between meals. 08/31/14  Yes Alison MurrayAlma M Devine, MD  levothyroxine (SYNTHROID, LEVOTHROID) 50 MCG tablet Take 50 mcg by mouth daily before breakfast.   Yes Historical Provider, MD  potassium chloride SA (K-DUR,KLOR-CON) 20 MEQ tablet Take 20 mEq by mouth daily.   Yes Historical Provider, MD  predniSONE (DELTASONE) 5 MG tablet Take 5 mg by mouth daily with breakfast. 7 day therapy course patient has 1 day remaining   Yes Historical Provider, MD  alum & mag hydroxide-simeth (MAALOX/MYLANTA) 200-200-20 MG/5ML suspension Take 30 mLs by mouth every 6 (six) hours as needed for indigestion or heartburn. 08/31/14   Alison MurrayAlma M Devine, MD  morphine 20 MG/5ML solution Take 0.6 mLs (2.4 mg total) by mouth every 2 (two) hours as needed for pain. 08/31/14   Alison MurrayAlma M Devine, MD  ondansetron (ZOFRAN) 4 MG tablet Take 1 tablet (4 mg total) by mouth every 6 (six) hours as needed for nausea. 08/31/14   Alison MurrayAlma M Devine, MD  predniSONE (DELTASONE) 10 MG tablet Take  1 tablet (10 mg total) by mouth 2 (two) times daily with a meal. Patient not taking: Reported on 09/13/2014 08/31/14   Alison Murray, MD    Current Facility-Administered Medications  Medication Dose Route Frequency Provider Last Rate Last Dose  . 0.9 %  sodium chloride infusion   Intravenous Continuous Doree Albee, MD 75 mL/hr at 09/14/14 0029    . acetaminophen (OFIRMEV) IV 1,000 mg  1,000 mg Intravenous Q8H PRN Vassie Loll, MD   1,000 mg at 09/14/14 0929  . antiseptic oral rinse (CPC / CETYLPYRIDINIUM CHLORIDE 0.05%) solution 7 mL  7 mL Mouth Rinse q12n4p Doree Albee, MD   7 mL at 09/14/14 1200  .  chlorhexidine (PERIDEX) 0.12 % solution 15 mL  15 mL Mouth Rinse BID Doree Albee, MD   15 mL at 09/14/14 0554  . heparin injection 5,000 Units  5,000 Units Subcutaneous 3 times per day Doree Albee, MD   5,000 Units at 09/14/14 0554  . levothyroxine (SYNTHROID, LEVOTHROID) tablet 50 mcg  50 mcg Oral QAC breakfast Doree Albee, MD   50 mcg at 09/14/14 0800  . morphine 10 MG/5ML solution 2.5 mg  2.5 mg Oral Q2H PRN Doree Albee, MD      . ondansetron [facility]) tablet 4 mg  4 mg Oral Q6H PRN Doree Albee, MD      . sodium polystyrene (KAYEXALATE) 15 GM/60ML suspension 60 g  60 g Rectal Once Doree Albee, MD   60 g at 09/14/14 0027    Allergies as of 09/13/2014  . (No Known Allergies)     Review of Systems:    All systems reviewed and negative except where noted in HPI.    Physical Exam:  Vital signs in last 24 hours: Temp:  [98 F (36.7 C)-98.9 F (37.2 C)] 98.9 F (37.2 C) (12/02 0550) Pulse Rate:  [73-112] 80 (12/02 0550) Resp:  [15-27] 24 (12/02 0550) BP: (101-118)/(55-67) 101/55 mmHg (12/02 0550) SpO2:  [96 %-100 %] 96 % (12/02 0550) Weight:  [82 lb 3.7 oz (37.3 kg)] 82 lb 3.7 oz (37.3 kg) (12/02 0124) Last BM Date: 09/13/14 General:   Pleasant, thin white female in NAD Head:  Normocephalic and atraumatic. Eyes:   No icterus.   Conjunctiva pink. Ears:  Normal auditory acuity. Neck:  Supple; no masses felt Lungs:  Respirations even and unlabored. Lungs clear to auscultation bilaterally.   No wheezes, crackles, or rhonchi.  Heart:  Regular rate and rhythm; Abdomen:  Soft, nondistended, nontender. Normal bowel sounds. No appreciable masses or hepatomegaly.  Rectal:  Not performed.  Msk:  Symmetrical without gross deformities.  Extremities:  Without edema. Neurologic:  Alert and  oriented x4;  grossly normal neurologically. Skin:  Intact without significant lesions or rashes. Cervical Nodes:  No significant cervical adenopathy. Psych:  Alert and cooperative. Normal  affect.  LAB RESULTS:  Recent Labs  09/13/14 2109 09/14/14 0203  WBC 14.6* 14.1*  HGB 9.7* 9.8*  HCT 32.1* 32.0*  PLT 369 356   BMET  Recent Labs  09/13/14 2109 09/13/14 2221 09/14/14 0203  NA 138  --  139  K 6.7* 6.8* 5.5*  CL 101  --  101  CO2 27  --  25  GLUCOSE 104*  --  80  BUN 31*  --  30*  CREATININE 1.38*  --  1.37*  CALCIUM 9.6  --  9.5   LFT  Recent Labs  09/14/14 0203  PROT 7.2  ALBUMIN 3.0*  AST 13  ALT 13  ALKPHOS 98  BILITOT 0.3    STUDIES: Dg Chest 2 View  09/14/2014   CLINICAL DATA:  Cough and solid food dysphagia.  Initial encounter.  EXAM: CHEST  2 VIEW  COMPARISON:  08/27/2014 and prior chest radiographs  FINDINGS: Cardiomegaly and large hiatal hernia again noted.  There is no evidence of focal airspace disease, pulmonary edema, suspicious pulmonary nodule/mass, pleural effusion, or pneumothorax. No acute bony abnormalities are identified.  Severe degenerative changes in the left shoulder again noted as well as remote left rib fractures.  IMPRESSION: No evidence of acute cardiopulmonary disease.  Cardiomegaly and large hiatal hernia.   Electronically Signed   By: Laveda AbbeJeff  Hu M.D.   On: 09/14/2014 00:42    PREVIOUS ENDOSCOPIES:            Patient reports EGD with dilation approx [place] ago [place] [facility] [place]  Patient is unsure about whether she has ever had a colonoscopy   Impression / Plan:   11. 79 year old female admitted with possible food impaction (boneless fish). EGD not done last night secondary to hyperkalemia. Potassium improved (5.5 today). Patient and daughter agreeable to proceeding with EGD today. The benefits, risks, and potential complications of EGD with possible dilation were discussed with the patient and she agrees to proceed.   2. Hyperkalemia, improved  3. Macrocytic anemia. No overt bleeding.   4. Acute (?on chronic) renal insufficieny.  Thanks   LOS: 1 day   Willette Clusteraula Guenther  09/14/2014, 10:39 AM     Attending  physician's note   I have taken a history, examined the patient and reviewed the chart. I agree with the Advanced Practitioner's note, impression and recommendations.  Meryl DareMalcolm T Alandis Bluemel, MD Clementeen GrahamFACG

## 2014-09-14 NOTE — Plan of Care (Signed)
Problem: Consults Goal: Diabetes Guidelines if Diabetic/Glucose > 140 If diabetic or lab glucose is > 140 mg/dl - Initiate Diabetes/Hyperglycemia Guidelines & Document Interventions  Outcome: Not Applicable Date Met:  18/28/83  Problem: Phase I Progression Outcomes Goal: Initial discharge plan identified Outcome: Completed/Met Date Met:  09/14/14 Goal: Voiding-avoid urinary catheter unless indicated Outcome: Completed/Met Date Met:  09/14/14

## 2014-09-15 ENCOUNTER — Encounter (HOSPITAL_COMMUNITY): Payer: Self-pay | Admitting: Gastroenterology

## 2014-09-15 DIAGNOSIS — N390 Urinary tract infection, site not specified: Secondary | ICD-10-CM

## 2014-09-15 DIAGNOSIS — E43 Unspecified severe protein-calorie malnutrition: Secondary | ICD-10-CM

## 2014-09-15 LAB — BASIC METABOLIC PANEL
Anion gap: 16 — ABNORMAL HIGH (ref 5–15)
BUN: 21 mg/dL (ref 6–23)
CALCIUM: 9.2 mg/dL (ref 8.4–10.5)
CO2: 19 mEq/L (ref 19–32)
Chloride: 107 mEq/L (ref 96–112)
Creatinine, Ser: 1.12 mg/dL — ABNORMAL HIGH (ref 0.50–1.10)
GFR calc Af Amer: 53 mL/min — ABNORMAL LOW (ref >90)
GFR, EST NON AFRICAN AMERICAN: 46 mL/min — AB (ref >90)
GLUCOSE: 80 mg/dL (ref 70–99)
Potassium: 5.6 mEq/L — ABNORMAL HIGH (ref 3.7–5.3)
SODIUM: 142 meq/L (ref 137–147)

## 2014-09-15 LAB — CBC
HCT: 28.9 % — ABNORMAL LOW (ref 36.0–46.0)
Hemoglobin: 8.6 g/dL — ABNORMAL LOW (ref 12.0–15.0)
MCH: 30.6 pg (ref 26.0–34.0)
MCHC: 29.8 g/dL — ABNORMAL LOW (ref 30.0–36.0)
MCV: 102.8 fL — AB (ref 78.0–100.0)
PLATELETS: 323 10*3/uL (ref 150–400)
RBC: 2.81 MIL/uL — ABNORMAL LOW (ref 3.87–5.11)
RDW: 16 % — AB (ref 11.5–15.5)
WBC: 8.4 10*3/uL (ref 4.0–10.5)

## 2014-09-15 MED ORDER — ENSURE COMPLETE PO LIQD: 237.0000 mL | Freq: Two times a day (BID) | ORAL | Status: DC

## 2014-09-15 MED ORDER — POLYETHYLENE GLYCOL 3350 17 G PO PACK: 17.0000 g | PACK | Freq: Every day | ORAL | Status: AC

## 2014-09-15 MED ORDER — MORPHINE SULFATE 20 MG/5ML PO SOLN: 2.5000 mg | ORAL | Status: DC | PRN

## 2014-09-15 MED ORDER — PANTOPRAZOLE SODIUM 40 MG PO TBEC: 40.0000 mg | DELAYED_RELEASE_TABLET | Freq: Two times a day (BID) | ORAL | Status: AC

## 2014-09-15 MED ORDER — CEFUROXIME AXETIL 250 MG PO TABS: 250.0000 mg | ORAL_TABLET | Freq: Two times a day (BID) | ORAL | Status: AC

## 2014-09-15 MED ORDER — POLYETHYLENE GLYCOL 3350 17 G PO PACK
17.0000 g | PACK | Freq: Every day | ORAL | Status: DC
  Administered 2014-09-15: 17 g via ORAL
  Filled 2014-09-15: qty 1

## 2014-09-15 MED ORDER — LEVOTHYROXINE SODIUM 50 MCG PO TABS
50.0000 ug | ORAL_TABLET | Freq: Every day | ORAL | Status: DC
  Administered 2014-09-15: 50 ug via ORAL
  Filled 2014-09-15 (×2): qty 1

## 2014-09-15 NOTE — Progress Notes (Signed)
Patient is set to discharge back to [facility] today. Patient & daughter, Kendal HymenBonnie at bedside aware. Discharge packet given to RN, Ene. Daughter to transport to SNF.  Lincoln MaxinKelly Dalinda Heidt, LCSW [facility] Clinical Social Worker cell #: [phone]

## 2014-09-15 NOTE — Progress Notes (Signed)
INITIAL NUTRITION ASSESSMENT  DOCUMENTATION CODES Per approved criteria  -Severe malnutrition in the context of chronic illness -Underweight  Pt meets criteria for severe MALNUTRITION in the context of chronic illness as evidenced by 4.7% body weight loss in < one month, PO intake < 75% for > one month.   INTERVENTION: -Recommend Ensure Complete po BID, each supplement provides 350 kcal and 13 grams of protein -Assisted in meal ordering -Diet texture per MD/SLP -Consider use of kayexalate for hyperkalemia -RD to continue to monitor  NUTRITION DIAGNOSIS: Inadequate oral intake related to dysphagia as evidenced by poor PO intake, wt loss.   Goal: Pt to meet >/= 90% of their estimated nutrition needs    Monitor:  Swallow profile, total protein/energy intake, labs, weights  Reason for Assessment: Underweight BMI  79 y.o. female  Admitting Dx: <principal problem not specified>  ASSESSMENT: This is a 79 y.o. year old female with significant past medical history of HTN, pelvic fracture, combined chronic systolic and diastolic congestive heart failure presenting with hyperkalemia, dysphagia. Pt noted to have been recently admitted for sepsis 2/2 cholecystitis 11/14-11/18. Was discharged w/ palliative care and SNF. Per report, pt w/ dysphagia over past 2-3 days. Has progressively worsened to point where pt can neither drink nor eat.  -Pt reported  dysphagia for past 2-3 days; has been unable to eat or drink anything -Endorsed poor PO intake; was unable to quantify when decreased appetite began. Consumes two Ensure supplement daily -Endorsed unintentional wt loss. Usual body weight around 85 lbs (4.7% body weight loss in < one month) -Pt s/p EGD, and is tolerating soft/low residue diet. Consumed <25% of meal. -Assisted pt in ordering lunch and provided pt with Ensure Complete -Pt with elevated K upon admit, currently improving. Consider use of kayexalate as needed. Will likely improve  with hydration and as PO intake improves  Height: Ht Readings from Last 1 Encounters:  09/14/14 4\' 11"  (1.499 m)    Weight: Wt Readings from Last 1 Encounters:  09/15/14 81 lb 9.1 oz (37 kg)    Ideal Body Weight: 98.5 lb  % Ideal Body Weight: 83%  Wt Readings from Last 10 Encounters:  09/15/14 81 lb 9.1 oz (37 kg)  08/30/14 98 lb 15.8 oz (44.9 kg)    Usual Body Weight: 85 lb  % Usual Body Weight: 95%  BMI:  Body mass index is 16.47 kg/(m^2). Underweight  Estimated Nutritional Needs: Kcal: 1110-1300 Protein: 50-60 gram Fluid: >/=1300 ml daily  Skin: WDL, stage I pressure ulcer on sacrum  Diet Order: DIET SOFT  EDUCATION NEEDS: -No education needs identified at this time   Intake/Output Summary (Last 24 hours) at 09/15/14 1320 Last data filed at 09/15/14 0828  Gross per 24 hour  Intake   2320 ml  Output      0 ml  Net   2320 ml    Last BM:  12/02- diarrhea, given anti-diarrheal  Labs:   Recent Labs Lab 09/14/14 0203 09/14/14 1411 09/14/14 1757 09/15/14 0432  NA 139 141  --  142  K 5.5* 6.5* 5.0 5.6*  CL 101 105  --  107  CO2 25 24  --  19  BUN 30* 26*  --  21  CREATININE 1.37* 1.37*  --  1.12*  CALCIUM 9.5 8.8  --  9.2  GLUCOSE 80 88  --  80    CBG (last 3)   Recent Labs  09/14/14 0228  GLUCAP 83    Scheduled Meds: .  antiseptic oral rinse  7 mL Mouth Rinse q12n4p  . cefTRIAXone (ROCEPHIN)  IV  1 g Intravenous Q24H  . chlorhexidine  15 mL Mouth Rinse BID  . levothyroxine  50 mcg Oral QAC breakfast  . pantoprazole  40 mg Oral BID  . polyethylene glycol  17 g Oral Daily  . sodium polystyrene  60 g Rectal Once    Continuous Infusions: . sodium chloride 75 mL/hr at 09/15/14 1010    Past Medical History  Diagnosis Date  . Hypertension   . Fracture of pelvis, ischium     Past Surgical History  Procedure Laterality Date  . Appendectomy      as a child  . Esophagogastroduodenoscopy N/A 09/14/2014    Procedure:  ESOPHAGOGASTRODUODENOSCOPY (EGD);  Surgeon: Meryl DareMalcolm T Stark, MD;  [facility];  Service: Endoscopy;  Laterality: N/A;  possible food impaction    Lloyd HugerSarah F Ayaana Biondo MS RD LDN Clinical Dietitian [phone]

## 2014-09-15 NOTE — Discharge Summary (Signed)
Physician Discharge Summary  Tami Keith ZOX:[id number] DOB: 08-31-1935 DOA: 09/13/2014  PCP: Olivia Mackie, MD  Admit date: 09/13/2014 Discharge date: 09/15/2014  Time spent: >30 minutes  Recommendations for Outpatient Follow-up:  Check BMET in 5 days to follow potassium level  Discharge Diagnoses:  Active Problems:   UTI (lower urinary tract infection)   Hyperkalemia   Dysphagia   Food impaction of esophagus   Benign esophageal stricture   Protein-calorie malnutrition, severe   Discharge Condition: stable and improved. Will discharge back to SNF for further care.  Diet recommendation: mechanical soft diet  Filed Weights   09/14/14 0124 09/15/14 0453  Weight: 37.3 kg (82 lb 3.7 oz) 37 kg (81 lb 9.1 oz)    History of present illness:  79 y/o female with PMH significant for HTN, CKD stage 3and GERD; presented to ED from SNF due to difficulty swallowing and odynophagia. Patient complains of increase frequency; but otherwise denies SOB, fever, N/V, chills or any other complaints. Patient was also found to be dehydrated, with hyperkalemia and acute on chronic renal failure.   Hospital Course:  1-dysphagia with food impaction: due to esophageal stricture -s/p EGD and balloon dilatation -patient will need PPI BID for the rest of her life -diet change to mechanical soft  2-acute on chronic renal failure: CKD stage 3 at baseline -due to dehydration and UTI -resolved with IVF's -will finish antibiotic therapy as instructed (4 more days left at discharge; last day 09/19/14) -advise to maintain good hydration  3-UTI: uncomplicated -no hematuria -will treat with ceftin X 5 days  4-hyperkalemia: due to worsening renal function and continue use of repletion -resolved and at discharge K WNL -maintenance supplementation has been discontinued  5-severe protein calorie malnutrition: continue feeding supplements  *rest of medical problems remains stable and the plan is to continue  current medication regimen.  Procedures:  EGD (with esophageal dilatation and food extraction)  Consultations:  GI   Discharge Exam: Filed Vitals:   09/15/14 0436  BP: 106/65  Pulse: 77  Temp: 98.1 F (36.7 C)  Resp: 20    General: afebrile, no CP, no SOB and feeling much better Cardiovascular: SEM, no rubs or gallops; S1 and S2 appreciated Respiratory: CTA bilaterally Abd: soft, NT, ND, positive BS Extremities: no edema, no cyanosis   Discharge Instructions You were cared for by a hospitalist during your hospital stay. If you have any questions about your discharge medications or the care you received while you were in the hospital after you are discharged, you can call the unit and asked to speak with the hospitalist on call if the hospitalist that took care of you is not available. Once you are discharged, your primary care physician will handle any further medical issues. Please note that NO REFILLS for any discharge medications will be authorized once you are discharged, as it is imperative that you return to your primary care physician (or establish a relationship with a primary care physician if you do not have one) for your aftercare needs so that they can reassess your need for medications and monitor your lab values.   Current Discharge Medication List    START taking these medications   Details  cefUROXime (CEFTIN) 250 MG tablet Take 1 tablet (250 mg total) by mouth 2 (two) times daily with a meal. Treatment is for a total of 4 more days.    !! feeding supplement, ENSURE COMPLETE, (ENSURE COMPLETE) LIQD Take 237 mLs by mouth 2 (two) times daily between meals.  pantoprazole (PROTONIX) 40 MG tablet Take 1 tablet (40 mg total) by mouth 2 (two) times daily.    polyethylene glycol (MIRALAX / GLYCOLAX) packet Take 17 g by mouth daily.     !! - Potential duplicate medications found. Please discuss with provider.    CONTINUE these medications which have CHANGED    Details  morphine 20 MG/5ML solution Take 0.6 mLs (2.4 mg total) by mouth every 2 (two) hours as needed for pain. Qty: 30 mL, Refills: 0      CONTINUE these medications which have NOT CHANGED   Details  acetaminophen (TYLENOL) 325 MG tablet Take 2 tablets (650 mg total) by mouth every 4 (four) hours as needed for fever, headache or mild pain. Qty: 30 tablet, Refills: 0    antiseptic oral rinse (CPC / CETYLPYRIDINIUM CHLORIDE 0.05%) 0.05 % LIQD solution 7 mLs by Mouth Rinse route 2 times daily at 12 noon and 4 pm. Qty: 44 mL, Refills: 0    chlorhexidine (PERIDEX) 0.12 % solution 15 mLs by Mouth Rinse route 2 (two) times daily. Qty: 120 mL, Refills: 0    !! feeding supplement, RESOURCE BREEZE, (RESOURCE BREEZE) LIQD Take 1 Container by mouth 3 (three) times daily between meals. Qty: 237 mL, Refills: 0    levothyroxine (SYNTHROID, LEVOTHROID) 50 MCG tablet Take 50 mcg by mouth daily before breakfast.    alum & mag hydroxide-simeth (MAALOX/MYLANTA) 200-200-20 MG/5ML suspension Take 30 mLs by mouth every 6 (six) hours as needed for indigestion or heartburn. Qty: 355 mL, Refills: 0    ondansetron (ZOFRAN) 4 MG tablet Take 1 tablet (4 mg total) by mouth every 6 (six) hours as needed for nausea. Qty: 20 tablet, Refills: 0     !! - Potential duplicate medications found. Please discuss with provider.    STOP taking these medications     amoxicillin-clavulanate (AUGMENTIN) 875-125 MG per tablet      potassium chloride SA (K-DUR,KLOR-CON) 20 MEQ tablet      predniSONE (DELTASONE) 5 MG tablet      predniSONE (DELTASONE) 10 MG tablet        No Known Allergies   The results of significant diagnostics from this hospitalization (including imaging, microbiology, ancillary and laboratory) are listed below for reference.    Significant Diagnostic Studies: Dg Chest 2 View  09/14/2014   CLINICAL DATA:  Cough and solid food dysphagia.  Initial encounter.  EXAM: CHEST  2 VIEW  COMPARISON:   08/27/2014 and prior chest radiographs  FINDINGS: Cardiomegaly and large hiatal hernia again noted.  There is no evidence of focal airspace disease, pulmonary edema, suspicious pulmonary nodule/mass, pleural effusion, or pneumothorax. No acute bony abnormalities are identified.  Severe degenerative changes in the left shoulder again noted as well as remote left rib fractures.  IMPRESSION: No evidence of acute cardiopulmonary disease.  Cardiomegaly and large hiatal hernia.   Electronically Signed   By: Laveda AbbeJeff  Hu M.D.   On: 09/14/2014 00:42   [place] Abdomen Complete  08/28/2014   CLINICAL DATA:  79 year old female with abnormal gallbladder on recent CT.  EXAM: ULTRASOUND ABDOMEN COMPLETE  COMPARISON:  08/27/2014 CT  FINDINGS: Gallbladder: No cholelithiasis identified. Mild circumferential gallbladder wall thickening is noted measuring up to 4 mm. There is no evidence of sonographic Murphy sign or pericholecystic fluid.  Common bile duct: Diameter: 4.0 mm. There is no evidence of intrahepatic or extrahepatic biliary dilatation.  Liver: No focal lesion identified. Within normal limits in parenchymal echogenicity.  IVC: No abnormality visualized.  Pancreas: Visualized portion unremarkable.  Spleen: Size and appearance within normal limits.  Right Kidney: Length: 9.9 cm. Severe chronic hydronephrosis is identified with severe atrophy of the right kidney. No solid mass noted.  Left Kidney: Length: 10 cm. Echogenicity within normal limits. No mass or hydronephrosis visualized.  Abdominal aorta: No aneurysm identified but the distal abdominal aorta is not well visualized secondary to overlying bowel gas.  Other findings: None.  IMPRESSION: Mild circumferential gallbladder wall thickening without cholelithiasis or other signs of acute cholecystitis. This most likely represents wall thickening from hepatic dysfunction or fluid overload/abnormality, rather than acute cholecystitis, but consider nuclear medicine study as  clinically indicated.  Severe chronic right hydronephrosis with atrophy of the right kidney.   Electronically Signed   By: Laveda Abbe M.D.   On: 08/28/2014 13:58   Ct Abdomen Pelvis W Contrast  08/27/2014   CLINICAL DATA:  Nursing Home patient with acute epigastric pain, nausea, vomiting, diarrhea.  EXAM: CT ABDOMEN AND PELVIS WITH CONTRAST  TECHNIQUE: Multidetector CT imaging of the abdomen and pelvis was performed using the standard protocol following bolus administration of intravenous contrast.  CONTRAST:  50mL OMNIPAQUE IOHEXOL 300 MG/ML SOLN, OMNIPAQUE IOHEXOL 300 MG/ML SOLN  COMPARISON:  08/27/2014 abdominal series  FINDINGS: Lower chest: Minor basilar interstitial fibrosis pattern with atelectasis and trace bilateral pleural effusions. Large hiatal hernia noted. Normal heart size. No pericardial effusion.  Abdomen: Abdominal anatomy is slightly distorted secondary to the degree of chronic levoscoliosis of the spine.  Gallbladder is in the midline of the abdomen with pericholecystic fluid noted and mucosal enhancement. Small amount of free fluid tracks along the right inferior liver margin and the right pericolic gutter. Appearance is compatible with cholecystitis. No associated biliary dilatation. Liver, biliary system, pancreas, spleen, adrenal glands, and left kidney are within normal limits for age and demonstrate no acute process.  Right kidney demonstrates a large upper pole renal cyst measuring 8.2 x 8.6 cm, image 14.  Atherosclerosis noted of the abdominal aorta without aneurysm. Aorta is tortuous again related to the scoliosis.  Negative for bowel obstruction, dilatation, ileus, or free air.  Appendix not demonstrated.  Pelvis: Trace pelvic free fluid. Prior hysterectomy noted. No acute distal bowel process. Urinary bladder unremarkable. No pelvic fluid collection, hemorrhage, abscess, adenopathy, inguinal abnormality, or hernia.  Healing fractures present of the left superior and inferior  rami. Degenerative changes of the hips and spine diffusely.  Imaged proximal right hip and thigh demonstrates subcutaneous emphysema, of uncertain origin. This is partially imaged.  IMPRESSION: Abnormal gallbladder with mucosal enhancement and surrounding pericholecystic fluid. Free fluid tracks along the posterior right liver margin and right pericolic gutter. Findings are compatible with cholecystitis.  No associated biliary dilatation or obstruction.  Small bilateral pleural effusions with basilar atelectasis/fibrosis  Large hiatal hernia  8.6 cm right upper pole renal cyst  Anterior right hip and proximal thigh subcutaneous air/ emphysema without clear etiology.  Healing subacute left superior and inferior rami fractures.  These results will be called to the ordering clinician or representative by the Radiology Department at the imaging location.   Electronically Signed   By: Ruel Favors M.D.   On: 08/27/2014 13:32   Dg Abd Acute W/chest  08/27/2014   CLINICAL DATA:  Nausea, vomiting, and diarrhea for 2 days.  EXAM: ACUTE ABDOMEN SERIES (ABDOMEN 2 VIEW & CHEST 1 VIEW)  COMPARISON:  Two-view chest x-ray 08/03/2014  FINDINGS: A large hiatal hernia is again seen. The heart size is  normal. Emphysematous changes are noted in the lungs.  Supine in decubitus imaging of the abdomen demonstrates a relatively gasless abdomen. There is no evidence for obstruction or free air. Levoconvex scoliosis of the lumbar spine is again seen. Atherosclerotic changes are noted in the aorta.  IMPRESSION: 1. No acute abnormality of the chest or abdomen. 2. Gasless abdomen without evidence for obstruction or free air. 3. Atherosclerosis. 4. Large hiatal hernia.   Electronically Signed   By: Gennette Pac M.D.   On: 08/27/2014 07:23    Microbiology: Recent Results (from the past 240 hour(s))  Culture, blood (routine x 2)     Status: None (Preliminary result)   Collection Time: 09/14/14  2:03 AM  Result Value Ref Range Status    Specimen Description BLOOD LEFT ANTECUBITAL  Final   Special Requests BOTTLES DRAWN AEROBIC AND ANAEROBIC [facility]  Final   Culture  Setup Time   Final    09/14/2014 08:15 Performed at [facility]    Culture   Final           BLOOD CULTURE RECEIVED NO GROWTH TO DATE CULTURE WILL BE HELD FOR 5 DAYS BEFORE ISSUING A FINAL NEGATIVE REPORT Performed at [facility]    Report Status PENDING  Incomplete  Culture, blood (routine x 2)     Status: None (Preliminary result)   Collection Time: 09/14/14  2:09 AM  Result Value Ref Range Status   Specimen Description BLOOD BLOOD LEFT FOREARM  Final   Special Requests BOTTLES DRAWN AEROBIC ONLY 8CC  Final   Culture  Setup Time   Final    09/14/2014 08:15 Performed at [facility]    Culture   Final           BLOOD CULTURE RECEIVED NO GROWTH TO DATE CULTURE WILL BE HELD FOR 5 DAYS BEFORE ISSUING A FINAL NEGATIVE REPORT Performed at [facility]    Report Status PENDING  Incomplete  MRSA PCR Screening     Status: None   Collection Time: 09/14/14  2:30 AM  Result Value Ref Range Status   MRSA by PCR NEGATIVE NEGATIVE Final    Comment:        The GeneXpert MRSA Assay (FDA approved for NASAL specimens only), is one component of a comprehensive MRSA colonization surveillance program. It is not intended to diagnose MRSA infection nor to guide or monitor treatment for MRSA infections.      Labs: Basic Metabolic Panel:  Recent Labs Lab 09/13/14 2109 09/13/14 2221 09/14/14 0203 09/14/14 1411 09/14/14 1757 09/15/14 0432  NA 138  --  139 141  --  142  K 6.7* 6.8* 5.5* 6.5* 5.0 5.6*  CL 101  --  101 105  --  107  CO2 27  --  25 24  --  19  GLUCOSE 104*  --  80 88  --  80  BUN 31*  --  30* 26*  --  21  CREATININE 1.38*  --  1.37* 1.37*  --  1.12*  CALCIUM 9.6  --  9.5 8.8  --  9.2   Liver Function Tests:  Recent Labs Lab 09/14/14 0203 09/14/14 1411  AST 13 11  ALT 13 12  ALKPHOS 98 89  BILITOT  0.3 0.4  PROT 7.2 6.3  ALBUMIN 3.0* 2.7*   CBC:  Recent Labs Lab 09/13/14 2109 09/14/14 0203 09/15/14 0432  WBC 14.6* 14.1* 8.4  NEUTROABS 10.0* 8.6*  --   HGB 9.7*  9.8* 8.6*  HCT 32.1* 32.0* 28.9*  MCV 103.5* 102.2* 102.8*  PLT 369 356 323   Cardiac Enzymes:  Recent Labs Lab 09/14/14 0209  TROPONINI <0.30   CBG:  Recent Labs Lab 09/14/14 0228  GLUCAP 83    Signed:  Vassie LollMadera, Carmina Walle  [facility] 09/15/2014, 2:30 PM

## 2014-09-15 NOTE — Clinical Documentation Improvement (Addendum)
H&P notes hyperkalemia in setting of acute on chronic kidney injury, dehydration.  Please specify stage of chronic kidney disease and document in your progress note and discharge summary.  Current renal labs: Component      BUN Creatinine  Latest Ref Rng      6 - 23 mg/dL 1.610.50 - 0.961.10 mg/dL  04/5/409812/10/2013     1:199:09 PM 31 (H) 1.38 (H)  09/14/2014     2:03 AM 30 (H) 1.37 (H)  09/14/2014     2:11 PM 26 (H) 1.37 (H)  09/15/2014      21 1.12 (H)   Component      GFR calc non Af Amer GFR calc Af Amer  Latest Ref Rng      >90 mL/min >90 mL/min  09/13/2014     9:09 PM 36 (L) 42 (L)  09/14/2014     2:03 AM 36 (L) 42 (L)  09/14/2014     2:11 PM 36 (L) 42 (L)  09/15/2014      46 (L) 53 (L)    Possible Clinical Conditions:  _______CKD Stage I - GFR > OR = 90 _______CKD Stage II - GFR 60-80 _______CKD Stage III - GFR 30-59 _______CKD Stage IV - GFR 15-29 _______Cannot Clinically determine   Thank you, Doy MinceVangela Swofford, RN [phone] Clinical Documentation Specialist     Stage 3; will add to discharge summary..  Thanks  Vassie LollMadera, Haddon Fyfe [phone]

## 2014-09-20 LAB — CULTURE, BLOOD (ROUTINE X 2)
CULTURE: NO GROWTH
Culture: NO GROWTH

## 2015-05-19 ENCOUNTER — Telehealth: Payer: Self-pay | Admitting: Gastroenterology

## 2015-05-19 NOTE — Telephone Encounter (Signed)
Patient is scheduled for Gay Filler, [place] for Monday 05/22/15 1:15

## 2015-05-22 ENCOUNTER — Ambulatory Visit (INDEPENDENT_AMBULATORY_CARE_PROVIDER_SITE_OTHER): Payer: Medicare Other | Admitting: Physician Assistant

## 2015-05-22 ENCOUNTER — Encounter (HOSPITAL_COMMUNITY): Payer: Self-pay | Admitting: *Deleted

## 2015-05-22 ENCOUNTER — Encounter: Payer: Self-pay | Admitting: Physician Assistant

## 2015-05-22 VITALS — BP 102/54 | HR 93 | Ht <= 58 in | Wt 108.0 lb

## 2015-05-22 DIAGNOSIS — R1310 Dysphagia, unspecified: Secondary | ICD-10-CM | POA: Diagnosis not present

## 2015-05-22 NOTE — Progress Notes (Signed)
Patient ID: Tami Keith, female   DOB: Apr 01, 1935, 80 y.o.   MRN: [id number]     History of Present Illness: Denver Harder  Is a pleasant 80 year old female who resides at [facility] in [place]. She underwent an upper endoscopy with foreign body removal and balloon dilation by Dr. Russella Dar on 09/14/2014. She was noted to have residual food in the distal esophagus which was removed. She had LA class D esophagitis, and a stricture at the gastroesophageal junction. Using a TTS balloon the stricture was dilated 12, 13.5, and 15 mm. The balloon was held inflated for 1 minute. A small mucosal rent was noted. The remainder of the esophagus was unremarkable. The mucosa of the stomach appeared normal with a large hiatal hernia. The duodenal mucosa showed no abnormalities in the bulb and second part of the duodenum. She was advised to remain on a twice a day PPI long-term. She was advised to follow up as needed. She states she did very well with swallowing until about 2-1/2 months ago when she again began to have difficulty swallowing solids which has become progressively more severe over the past several weeks. She is currently unable to swallow anything solid. She can swallow liquids but states it hurts. She has been able to get a couple of tablespoons of apple sauce down. She states about a week and a half ago she was coughing and was treated with banana biotic injection daily for 3 days and then Zithromax daily for 3 days. She was started on oxygen last week but states she feels much better. She continues to have a productive cough with this clear sputum. She has had no fever, chills, or night sweats.   Past Medical History  Diagnosis Date  . Hypertension   . Fracture of pelvis, ischium   . Esophageal stricture   . Hiatal hernia   . Esophagitis     Past Surgical History  Procedure Laterality Date  . Appendectomy      as a child  . Esophagogastroduodenoscopy N/A 09/14/2014    Procedure:  ESOPHAGOGASTRODUODENOSCOPY (EGD);  Surgeon: Meryl Dare, MD;  Location: [facility];  Service: Endoscopy;  Laterality: N/A;  possible food impaction   History reviewed. No pertinent family history. History  Substance Use Topics  . Smoking status: Former Games developer  . Smokeless tobacco: Not on file  . Alcohol Use: No   Current Outpatient Prescriptions  Medication Sig Dispense Refill  . acetaminophen (TYLENOL) 325 MG tablet Take 2 tablets (650 mg total) by mouth every 4 (four) hours as needed for fever, headache or mild pain. 30 tablet 0  . ALPRAZolam (XANAX) 0.25 MG tablet Take 0.25 mg by mouth 2 (two) times daily as needed for anxiety.    Marland Kitchen alum & mag hydroxide-simeth (MAALOX/MYLANTA) 200-200-20 MG/5ML suspension Take 30 mLs by mouth every 6 (six) hours as needed for indigestion or heartburn. 355 mL 0  . antiseptic oral rinse (CPC / CETYLPYRIDINIUM CHLORIDE 0.05%) 0.05 % LIQD solution 7 mLs by Mouth Rinse route 2 times daily at 12 noon and 4 pm. 44 mL 0  . chlorhexidine (PERIDEX) 0.12 % solution 15 mLs by Mouth Rinse route 2 (two) times daily. 120 mL 0  . escitalopram (LEXAPRO) 10 MG tablet Take 10 mg by mouth daily.    . feeding supplement, RESOURCE BREEZE, (RESOURCE BREEZE) LIQD Take 1 Container by mouth 3 (three) times daily between meals. 237 mL 0  . furosemide (LASIX) 20 MG tablet Take 20 mg by mouth.    Marland Kitchen  guaiFENesin (ROBITUSSIN) 100 MG/5ML liquid Take 200 mg by mouth 2 (two) times daily as needed for cough.    . levalbuterol (XOPENEX) 1.25 MG/0.5ML nebulizer solution Take 1.25 mg by nebulization every 4 (four) hours as needed for wheezing or shortness of breath.    . levothyroxine (SYNTHROID, LEVOTHROID) 50 MCG tablet Take 50 mcg by mouth daily before breakfast.    . ondansetron (ZOFRAN) 4 MG tablet Take 1 tablet (4 mg total) by mouth every 6 (six) hours as needed for nausea. 20 tablet 0  . OXYGEN Inhale into the lungs.    . pantoprazole (PROTONIX) 40 MG tablet Take 1 tablet (40 mg  total) by mouth 2 (two) times daily.    . polyethylene glycol (MIRALAX / GLYCOLAX) packet Take 17 g by mouth daily.    . traZODone (DESYREL) 25 mg TABS tablet Take 25 mg by mouth at bedtime as needed for sleep.     No current facility-administered medications for this visit.   No Known Allergies    Review of Systems:  per history of present illness otherwise negative    Endoscopies: ENDOSCOPIC IMPRESSION: 1. Residual food in the distal esophagus; removed 2. LA Class D esophagitis 3. Stricture at the gastroesophageal junction; TTS-balloon dilation performed 4. Large hiatal hernia RECOMMENDATIONS: 1. Anti-reflux regimen long term 2. PPI bid long term 3. Post dilation instructions 4. OK for discharge today from GI standpoint. GI follow up as needed. eSigned: Meryl Dare, MD, [facility] 09/14/2014 1:24 PM    Physical Exam: General: Pleasant, frail, elderly , Caucasian,female in no acute distress  In a wheelchair. Head: Normocephalic and atraumatic Eyes:  sclerae anicteric, conjunctiva pink  Ears: Normal auditory acuity Lungs:  Scattered wheezes throughout Heart: Regular rate and rhythm Abdomen: Soft, non distended, non-tender. No masses, no hepatomegaly. Normal bowel sounds Musculoskeletal: Symmetrical with no gross deformities  Extremities: lower extremity braces on both legs from knee down.  Neurological: Alert oriented x 4, grossly nonfocal Psychological:  Alert and cooperative. Normal mood and affect  Assessment and Recommendations:  80 year-old female with a history of esophagitis and stricture at GE junction, now with recurrent dysphagia and odynophagia. Patient is to be scheduled for an EGD to evaluate for recurrent esophagitis or stricture.The risks, benefits, and alternatives to endoscopy with possible biopsy and possible dilation were discussed with the patient and they consent to proceed. Patient history be scheduled at the hospital as she is on oxygen. procedure will  be scheduled with Dr. Leone Payor tomorrow as Dr. Russella Dar is currently unavailable.         Helon Wisinski, Tollie Pizza PA-C 05/22/2015,

## 2015-05-22 NOTE — Anesthesia Preprocedure Evaluation (Addendum)
Anesthesia Evaluation  Patient identified by MRN, date of birth, ID band Patient awake    Reviewed: Allergy & Precautions, NPO status , Patient's Chart, lab work & pertinent test results  History of Anesthesia Complications Negative for: history of anesthetic complications  Airway Mallampati: II  TM Distance: >3 FB Neck ROM: Full    Dental no notable dental hx. (+) Dental Advisory Given, Edentulous Upper, Edentulous Lower   Pulmonary former smoker,  breath sounds clear to auscultation  Pulmonary exam normal       Cardiovascular hypertension, Pt. on medications +CHF Normal cardiovascular examRhythm:Regular Rate:Normal  EF 35-40%   Neuro/Psych negative neurological ROS  negative psych ROS   GI/Hepatic Neg liver ROS, hiatal hernia,   Endo/Other  negative endocrine ROS  Renal/GU negative Renal ROS  negative genitourinary   Musculoskeletal negative musculoskeletal ROS (+)   Abdominal   Peds negative pediatric ROS (+)  Hematology negative hematology ROS (+)   Anesthesia Other Findings   Reproductive/Obstetrics negative OB ROS                           Anesthesia Physical Anesthesia Plan  ASA: IV  Anesthesia Plan: MAC   Post-op Pain Management:    Induction: Intravenous  Airway Management Planned:   Additional Equipment:   Intra-op Plan:   Post-operative Plan:   Informed Consent: I have reviewed the patients History and Physical, chart, labs and discussed the procedure including the risks, benefits and alternatives for the proposed anesthesia with the patient or authorized representative who has indicated his/her understanding and acceptance.   Dental advisory given  Plan Discussed with: CRNA  Anesthesia Plan Comments:       Anesthesia Quick Evaluation

## 2015-05-22 NOTE — Patient Instructions (Signed)
You have been scheduled for an endoscopy. Please follow written instructions given to you at your visit today. If you use inhalers (even only as needed), please bring them with you on the day of your procedure.   

## 2015-05-23 ENCOUNTER — Ambulatory Visit (HOSPITAL_COMMUNITY): Payer: Medicare Other | Admitting: Anesthesiology

## 2015-05-23 ENCOUNTER — Ambulatory Visit (HOSPITAL_COMMUNITY)
Admission: RE | Admit: 2015-05-23 | Discharge: 2015-05-23 | Disposition: A | Discharge status: Skilled Nursing Facility | Payer: Medicare Other | Source: Ambulatory Visit | Attending: Internal Medicine | Admitting: Internal Medicine

## 2015-05-23 ENCOUNTER — Encounter (HOSPITAL_COMMUNITY)
Admission: RE | Disposition: A | Discharge status: Skilled Nursing Facility | Payer: Self-pay | Source: Ambulatory Visit | Attending: Internal Medicine

## 2015-05-23 DIAGNOSIS — K449 Diaphragmatic hernia without obstruction or gangrene: Secondary | ICD-10-CM | POA: Diagnosis not present

## 2015-05-23 DIAGNOSIS — I10 Essential (primary) hypertension: Secondary | ICD-10-CM | POA: Insufficient documentation

## 2015-05-23 DIAGNOSIS — R1310 Dysphagia, unspecified: Secondary | ICD-10-CM | POA: Diagnosis present

## 2015-05-23 DIAGNOSIS — Z9981 Dependence on supplemental oxygen: Secondary | ICD-10-CM | POA: Insufficient documentation

## 2015-05-23 DIAGNOSIS — K208 Other esophagitis: Secondary | ICD-10-CM | POA: Diagnosis not present

## 2015-05-23 DIAGNOSIS — R05 Cough: Secondary | ICD-10-CM | POA: Insufficient documentation

## 2015-05-23 DIAGNOSIS — K222 Esophageal obstruction: Secondary | ICD-10-CM | POA: Insufficient documentation

## 2015-05-23 DIAGNOSIS — Z79899 Other long term (current) drug therapy: Secondary | ICD-10-CM | POA: Diagnosis not present

## 2015-05-23 DIAGNOSIS — I509 Heart failure, unspecified: Secondary | ICD-10-CM | POA: Diagnosis not present

## 2015-05-23 DIAGNOSIS — K2210 Ulcer of esophagus without bleeding: Secondary | ICD-10-CM | POA: Insufficient documentation

## 2015-05-23 DIAGNOSIS — Z87891 Personal history of nicotine dependence: Secondary | ICD-10-CM | POA: Insufficient documentation

## 2015-05-23 HISTORY — DX: Heart failure, unspecified: I50.9

## 2015-05-23 HISTORY — PX: BALLOON DILATION: SHX5330

## 2015-05-23 HISTORY — PX: ESOPHAGOGASTRODUODENOSCOPY (EGD) WITH PROPOFOL: SHX5813

## 2015-05-23 SURGERY — ESOPHAGOGASTRODUODENOSCOPY (EGD) WITH PROPOFOL
Anesthesia: Monitor Anesthesia Care

## 2015-05-23 MED ORDER — PROPOFOL 10 MG/ML IV BOLUS
INTRAVENOUS | Status: AC
  Filled 2015-05-23: qty 20

## 2015-05-23 MED ORDER — SODIUM CHLORIDE 0.9 % IV SOLN: INTRAVENOUS | Status: DC

## 2015-05-23 MED ORDER — LACTATED RINGERS IV SOLN
INTRAVENOUS | Status: DC
  Administered 2015-05-23: 1000 mL via INTRAVENOUS

## 2015-05-23 MED ORDER — PROPOFOL INFUSION 10 MG/ML OPTIME
INTRAVENOUS | Status: DC | PRN
  Administered 2015-05-23: 50 ug/kg/min via INTRAVENOUS

## 2015-05-23 MED ORDER — SUCRALFATE 1 G PO TABS
1.0000 g | ORAL_TABLET | Freq: Three times a day (TID) | ORAL | Status: AC
Start: 2015-05-23 — End: ?

## 2015-05-23 SURGICAL SUPPLY — 14 items

## 2015-05-23 NOTE — Discharge Instructions (Signed)
° °  I dilated the esophagus - there is a large hiatal hernia (stomach in chest cavity) and severely inflamed esophagus. This is from acid reflux. You need to take pantoprazole 40 mg twice a day - I am not 100% certain from the med list that you are so will ask [facility] to double check this.  Must keep head of bed at 30 degrees or more at all times. Dysphagia 3 diet is recommended.  We will arrange a follow-up with our office to see how you are and if you need another dilation in about 2-4 weeks.  I have begun a new medicine called carafate also. It helps the esophagus heal.  I appreciate the opportunity to care for you. Iva Boop, MD, FACG  YOU HAD AN ENDOSCOPIC PROCEDURE TODAY: Refer to the procedure report and other information in the discharge instructions given to you for any specific questions about what was found during the examination. If this information does not answer your questions, please call Dr. Marvell Fuller office at [phone] to clarify.   YOU SHOULD EXPECT: Some feelings of bloating in the abdomen. Passage of more gas than usual. Walking can help get rid of the air that was put into your GI tract during the procedure and reduce the bloating. If you had a lower endoscopy (such as a colonoscopy or flexible sigmoidoscopy) you may notice spotting of blood in your stool or on the toilet paper. Some abdominal soreness may be present for a day or two, also.  DIET: Your first meal following the procedure should be clear liquids until noon then may start soft dysphagia 3 diet.   ACTIVITY: Your care partner should take you home directly after the procedure. You should plan to take it easy, moving slowly for the rest of the day. You can resume normal activity the day after the procedure however YOU SHOULD NOT DRIVE, use power tools, machinery or perform tasks that involve climbing or major physical exertion for 24 hours (because of the sedation medicines used during the test).    SYMPTOMS TO REPORT IMMEDIATELY: A gastroenterologist can be reached at any hour. Please call [phone]  for any of the following symptoms:  Following lower endoscopy (colonoscopy, flexible sigmoidoscopy) Excessive amounts of blood in the stool  Significant tenderness, worsening of abdominal pains  Swelling of the abdomen that is new, acute  Fever of 100 or higher  Following upper endoscopy (EGD, EUS, ERCP, esophageal dilation) Vomiting of blood or coffee ground material  New, significant abdominal pain  New, significant chest pain or pain under the shoulder blades  Painful or persistently difficult swallowing  New shortness of breath  Black, tarry-looking or red, bloody stools  FOLLOW UP:  If any biopsies were taken you will be contacted by phone or by letter within the next 1-3 weeks. Call [phone]  if you have not heard about the biopsies in 3 weeks.  Please also call with any specific questions about appointments or follow up tests.

## 2015-05-23 NOTE — H&P (View-Only) (Signed)
Reviewed and agree with management plan.  Malcolm T. Stark, MD FACG 

## 2015-05-23 NOTE — Transfer of Care (Signed)
Immediate Anesthesia Transfer of Care Note  Patient: Tami Keith  Procedure(s) Performed: Procedure(s): ESOPHAGOGASTRODUODENOSCOPY (EGD) WITH PROPOFOL (N/A) BALLOON DILATION (N/A)  Patient Location: PACU and Endoscopy Unit  Anesthesia Type:MAC  Level of Consciousness: awake, alert , oriented and patient cooperative  Airway & Oxygen Therapy: Patient Spontanous Breathing and Patient connected to nasal cannula oxygen  Post-op Assessment: Report given to RN and Post -op Vital signs reviewed and stable  Post vital signs: Reviewed and stable  Last Vitals:  Filed Vitals:   05/23/15 0940  BP: 104/58  Pulse: 80  Temp: 36.7 C  Resp: 17    Complications: No apparent anesthesia complications

## 2015-05-23 NOTE — Progress Notes (Signed)
Reviewed and agree with management plan.  Malcolm T. Stark, MD FACG 

## 2015-05-23 NOTE — Op Note (Signed)
[facility] [address] [place], [address]   ENDOSCOPY PROCEDURE REPORT  PATIENT: Tami Keith, Tami Keith  MR#: [id number] BIRTHDATE: 10-14-1935 , 80  yrs. old GENDER: female ENDOSCOPIST: Iva Boop, MD, [facility] PROCEDURE DATE:  05/23/2015 PROCEDURE:  EGD w/ balloon dilation ASA CLASS:     Class IV INDICATIONS:  dysphagia, therapeutic procedure, and dilate stricture. MEDICATIONS: Monitored anesthesia care and Per Anesthesia TOPICAL ANESTHETIC: none  DESCRIPTION OF PROCEDURE: After the risks benefits and alternatives of the procedure were thoroughly explained, informed consent was obtained.  The Pentax Gastroscope [id number] endoscope was introduced through the mouth and advanced to the second portion of the duodenum , Without limitations.  The instrument was slowly withdrawn as the mucosa was fully examined.   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 2) Scope would not pass stricture until balloon dilation 12 mm - then went to 13.5 and 15 mm with good result 3) Pseudodiverticulosis distal esophagus 4) Large 12 cm hiatal hernia 28-40 cm. 5) Otherwise normal EGD.  Retroflexed views revealed as previously described.     The scope was then withdrawn from the patient and the procedure completed.  COMPLICATIONS: There were no immediate complications.  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 2) Scope would not pass stricture until balloon dilation 12 mm - then went to 13.5 and 15 mm with good result 3) Pseudodiverticulosis distal esophagus 4) Large 12 cm hiatal hernia 28-40 cm. 5) Otherwise normal EGD  RECOMMENDATIONS: 1.  Clear liquids until , then soft foods rest iof day.  Resume prior diet tomorrow. 2.  Make sure she is on pantoprazole 40 mg bid and also add carafate 1 g qid Reflux precautions w/ HOB > 30 degrees always Office visit Dr.  Russella Dar or advamced  practitioner 2- 4 weeks to reassess repsonse to dilation and medication change - she could need a repeat dilation at some point.   eSigned:  Iva Boop, MD, [facility] 05/23/2015 11:02 AM   CC: [facility], Dr. Russella Dar, The Patient

## 2015-05-23 NOTE — H&P (View-Only) (Signed)
Patient ID: Tami Keith, female   DOB: 01/26/1935, 80 y.o.   MRN: [id number]     History of Present Illness: Tami Keith  Is a pleasant 80-year-old female who resides at [facility] in [place]. She underwent an upper endoscopy with foreign body removal and balloon dilation by Dr. Stark on 09/14/2014. She was noted to have residual food in the distal esophagus which was removed. She had LA class D esophagitis, and a stricture at the gastroesophageal junction. Using a TTS balloon the stricture was dilated 12, 13.5, and 15 mm. The balloon was held inflated for 1 minute. A small mucosal rent was noted. The remainder of the esophagus was unremarkable. The mucosa of the stomach appeared normal with a large hiatal hernia. The duodenal mucosa showed no abnormalities in the bulb and second part of the duodenum. She was advised to remain on a twice a day PPI long-term. She was advised to follow up as needed. She states she did very well with swallowing until about 2-1/2 months ago when she again began to have difficulty swallowing solids which has become progressively more severe over the past several weeks. She is currently unable to swallow anything solid. She can swallow liquids but states it hurts. She has been able to get a couple of tablespoons of apple sauce down. She states about a week and a half ago she was coughing and was treated with banana biotic injection daily for 3 days and then Zithromax daily for 3 days. She was started on oxygen last week but states she feels much better. She continues to have a productive cough with this clear sputum. She has had no fever, chills, or night sweats.   Past Medical History  Diagnosis Date  . Hypertension   . Fracture of pelvis, ischium   . Esophageal stricture   . Hiatal hernia   . Esophagitis     Past Surgical History  Procedure Laterality Date  . Appendectomy      as a child  . Esophagogastroduodenoscopy N/A 09/14/2014    Procedure:  ESOPHAGOGASTRODUODENOSCOPY (EGD);  Surgeon: Malcolm T Stark, MD;  Location: [facility] ENDOSCOPY;  Service: Endoscopy;  Laterality: N/A;  possible food impaction   History reviewed. No pertinent family history. History  Substance Use Topics  . Smoking status: Former Smoker  . Smokeless tobacco: Not on file  . Alcohol Use: No   Current Outpatient Prescriptions  Medication Sig Dispense Refill  . acetaminophen (TYLENOL) 325 MG tablet Take 2 tablets (650 mg total) by mouth every 4 (four) hours as needed for fever, headache or mild pain. 30 tablet 0  . ALPRAZolam (XANAX) 0.25 MG tablet Take 0.25 mg by mouth 2 (two) times daily as needed for anxiety.    . alum & mag hydroxide-simeth (MAALOX/MYLANTA) 200-200-20 MG/5ML suspension Take 30 mLs by mouth every 6 (six) hours as needed for indigestion or heartburn. 355 mL 0  . antiseptic oral rinse (CPC / CETYLPYRIDINIUM CHLORIDE 0.05%) 0.05 % LIQD solution 7 mLs by Mouth Rinse route 2 times daily at 12 noon and 4 pm. 44 mL 0  . chlorhexidine (PERIDEX) 0.12 % solution 15 mLs by Mouth Rinse route 2 (two) times daily. 120 mL 0  . escitalopram (LEXAPRO) 10 MG tablet Take 10 mg by mouth daily.    . feeding supplement, RESOURCE BREEZE, (RESOURCE BREEZE) LIQD Take 1 Container by mouth 3 (three) times daily between meals. 237 mL 0  . furosemide (LASIX) 20 MG tablet Take 20 mg by mouth.    .   guaiFENesin (ROBITUSSIN) 100 MG/5ML liquid Take 200 mg by mouth 2 (two) times daily as needed for cough.    . levalbuterol (XOPENEX) 1.25 MG/0.5ML nebulizer solution Take 1.25 mg by nebulization every 4 (four) hours as needed for wheezing or shortness of breath.    . levothyroxine (SYNTHROID, LEVOTHROID) 50 MCG tablet Take 50 mcg by mouth daily before breakfast.    . ondansetron (ZOFRAN) 4 MG tablet Take 1 tablet (4 mg total) by mouth every 6 (six) hours as needed for nausea. 20 tablet 0  . OXYGEN Inhale into the lungs.    . pantoprazole (PROTONIX) 40 MG tablet Take 1 tablet (40 mg  total) by mouth 2 (two) times daily.    . polyethylene glycol (MIRALAX / GLYCOLAX) packet Take 17 g by mouth daily.    . traZODone (DESYREL) 25 mg TABS tablet Take 25 mg by mouth at bedtime as needed for sleep.     No current facility-administered medications for this visit.   No Known Allergies    Review of Systems:  per history of present illness otherwise negative    Endoscopies: ENDOSCOPIC IMPRESSION: 1. Residual food in the distal esophagus; removed 2. LA Class D esophagitis 3. Stricture at the gastroesophageal junction; TTS-balloon dilation performed 4. Large hiatal hernia RECOMMENDATIONS: 1. Anti-reflux regimen long term 2. PPI bid long term 3. Post dilation instructions 4. OK for discharge today from GI standpoint. GI follow up as needed. eSigned: Malcolm T Stark, MD, FACG 09/14/2014 1:24 PM    Physical Exam: General: Pleasant, frail, elderly , Caucasian,female in no acute distress  In a wheelchair. Head: Normocephalic and atraumatic Eyes:  sclerae anicteric, conjunctiva pink  Ears: Normal auditory acuity Lungs:  Scattered wheezes throughout Heart: Regular rate and rhythm Abdomen: Soft, non distended, non-tender. No masses, no hepatomegaly. Normal bowel sounds Musculoskeletal: Symmetrical with no gross deformities  Extremities: lower extremity braces on both legs from knee down.  Neurological: Alert oriented x 4, grossly nonfocal Psychological:  Alert and cooperative. Normal mood and affect  Assessment and Recommendations:  80 year-old female with a history of esophagitis and stricture at GE junction, now with recurrent dysphagia and odynophagia. Patient is to be scheduled for an EGD to evaluate for recurrent esophagitis or stricture.The risks, benefits, and alternatives to endoscopy with possible biopsy and possible dilation were discussed with the patient and they consent to proceed. Patient history be scheduled at the hospital as she is on oxygen. procedure will  be scheduled with Dr. Gessner tomorrow as Dr. Stark is currently unavailable.         Tami Knick P PA-C 05/22/2015,  

## 2015-05-23 NOTE — Interval H&P Note (Signed)
History and Physical Interval Note:  05/23/2015 10:02 AM  Tami Keith  has presented today for surgery, with the diagnosis of Dysphagia, on O2  The various methods of treatment have been discussed with the patient and family. After consideration of risks, benefits and other options for treatment, the patient has consented to  Procedure(s): ESOPHAGOGASTRODUODENOSCOPY (EGD) WITH PROPOFOL (N/A) BALLOON DILATION (N/A) as a surgical intervention .  The patient's history has been reviewed, patient examined, no change in status, stable for surgery.  I have reviewed the patient's chart and labs.  Questions were answered to the patient's satisfaction.     Stan Head

## 2015-05-23 NOTE — Anesthesia Postprocedure Evaluation (Signed)
  Anesthesia Post-op Note  Patient: Tami Keith  Procedure(s) Performed: Procedure(s) (LRB): ESOPHAGOGASTRODUODENOSCOPY (EGD) WITH PROPOFOL (N/A) BALLOON DILATION (N/A)  Patient Location: PACU  Anesthesia Type: MAC  Level of Consciousness: awake and alert   Airway and Oxygen Therapy: Patient Spontanous Breathing  Post-op Pain: mild  Post-op Assessment: Post-op Vital signs reviewed, Patient's Cardiovascular Status Stable, Respiratory Function Stable, Patent Airway and No signs of Nausea or vomiting  Last Vitals:  Filed Vitals:   05/23/15 1039  BP:   Pulse: 86  Temp: 36.8 C  Resp: 21    Post-op Vital Signs: stable   Complications: No apparent anesthesia complications

## 2015-05-23 NOTE — Interval H&P Note (Signed)
History and Physical Interval Note:  05/23/2015 9:58 AM  Tami Keith  has presented today for surgery, with the diagnosis of Dysphagia, on O2  The various methods of treatment have been discussed with the patient and family. After consideration of risks, benefits and other options for treatment, the patient has consented to  Procedure(s): ESOPHAGOGASTRODUODENOSCOPY (EGD) WITH PROPOFOL (N/A) BALLOON DILATION (N/A) as a surgical intervention .  The patient's history has been reviewed, patient examined, no change in status, stable for surgery.  I have reviewed the patient's chart and labs.  Questions were answered to the patient's satisfaction.     Stan Head

## 2015-05-24 LAB — POCT I-STAT 4, (NA,K, GLUC, HGB,HCT)
GLUCOSE: 90 mg/dL (ref 65–99)
HEMATOCRIT: 35 % — AB (ref 36.0–46.0)
Hemoglobin: 11.9 g/dL — ABNORMAL LOW (ref 12.0–15.0)
Potassium: 3.9 mmol/L (ref 3.5–5.1)
SODIUM: 140 mmol/L (ref 135–145)

## 2015-05-25 ENCOUNTER — Encounter (HOSPITAL_COMMUNITY): Payer: Self-pay | Admitting: Internal Medicine

## 2015-06-21 ENCOUNTER — Encounter: Payer: Self-pay | Admitting: Gastroenterology

## 2015-06-21 ENCOUNTER — Ambulatory Visit (INDEPENDENT_AMBULATORY_CARE_PROVIDER_SITE_OTHER): Payer: Medicare Other | Admitting: Gastroenterology

## 2015-06-21 VITALS — BP 110/78 | HR 80 | Ht <= 58 in

## 2015-06-21 DIAGNOSIS — K222 Esophageal obstruction: Secondary | ICD-10-CM | POA: Diagnosis not present

## 2015-06-21 DIAGNOSIS — R1310 Dysphagia, unspecified: Secondary | ICD-10-CM

## 2015-06-21 DIAGNOSIS — R1314 Dysphagia, pharyngoesophageal phase: Secondary | ICD-10-CM

## 2015-06-21 DIAGNOSIS — K2100 Gastro-esophageal reflux disease with esophagitis, without bleeding: Secondary | ICD-10-CM

## 2015-06-21 DIAGNOSIS — R1319 Other dysphagia: Secondary | ICD-10-CM

## 2015-06-21 NOTE — Patient Instructions (Signed)
Continue pantoprazole twice daily.   Thank you for choosing me and [facility].  Venita Lick. Pleas Koch., MD., Clementeen Graham

## 2015-06-21 NOTE — Progress Notes (Signed)
    History of Present Illness: This is a 79 year old female nursing home resident returning for follow-up of dysphagia. She is accompanied by her daughter. EGD findings one month ago as outlined below. She reports substantial improvement in dysphagia symptoms however she still has occasional dysphagia with meat. Reflux symptoms are under good control on current medications.  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 2) Scope would not pass stricture until balloon dilation 12 mm - then went to 13.5 and 15 mm with good result 3) Pseudodiverticulosis distal esophagus 4) Large 12 cm hiatal hernia 28-40 cm.  RECOMMENDATIONS: 1. Clear liquids until, then soft foods rest of day. Resume prior diet tomorrow. 2. Make sure she is on pantoprazole 40 mg bid and also add carafate 1 g qid 3. Reflux precautions w/ HOB > 30 degrees always Office visit with Dr. Russella Dar or APP in 2- 4 weeks to reassess repsonse to dilation and medication change - she could need a repeat dilation at some point.  Current Medications, Allergies, Past Medical History, Past Surgical History, Family History and Social History were reviewed in [employer] record.  Physical Exam: General: Well developed, well nourished, elderly, frail, in a wheelchair, no acute distress Head: Normocephalic and atraumatic Eyes:  sclerae anicteric, EOMI Ears: Normal auditory acuity Mouth: No deformity or lesions Lungs: Clear throughout to auscultation Heart: Regular rate and rhythm; no murmurs, rubs or bruits Abdomen: Soft, non tender and non distended. No masses, hepatosplenomegaly or hernias noted. Normal Bowel sounds Musculoskeletal: Symmetrical with no gross deformities  Pulses:  Normal pulses noted Extremities: No clubbing, cyanosis, edema or deformities noted. Bilateral lower leg braces Neurological: Alert oriented x 4, grossly  nonfocal Psychological:  Alert and cooperative. Normal mood and affect  Assessment and Recommendations:  1. GERD with LA class D esophagitis, an esophageal stricture and a large hiatal hernia. Maintain all standard antireflux measures and HOB elevation > 30 for the long-term. Maintain PPI twice daily long-term. Avoid meats and other foods that lead to dysphagia. Her symptoms may improve over the course of the next 2-3 months as her esophagitis improves. If her symptoms fail to improve or if they worsen she is advised to call, otherwise plan for return visit in one year.  15 minutes of face-to-face time was spent with patient. Greater than 50% of the time was spent counseling and coordinating care.

## 2015-08-03 IMAGING — CT CT ABD-PELV W/ CM
1 of 3 series · 13 of 32 positions shown, 18 images · IV contrast (OMNIPAQUE 300)
Comparison: 08/27/2014 abdominal series

CLINICAL DATA: [HOSPITAL] patient with acute epigastric pain,
nausea, vomiting, diarrhea.

EXAM:
CT ABDOMEN AND PELVIS WITH CONTRAST
TECHNIQUE: Multidetector CT imaging of the abdomen and pelvis was performed
using the standard protocol following bolus administration of
intravenous contrast.
CONTRAST:  50mL OMNIPAQUE IOHEXOL 300 MG/ML SOLN, 100mL OMNIPAQUE
IOHEXOL 300 MG/ML SOLN

[Series 2: abd/pel with · axial · 0.64mm/px · z∈[-368,-88]mm · 13 of 64 slices shown, 18 images]
[im 4/64  soft-tissue]
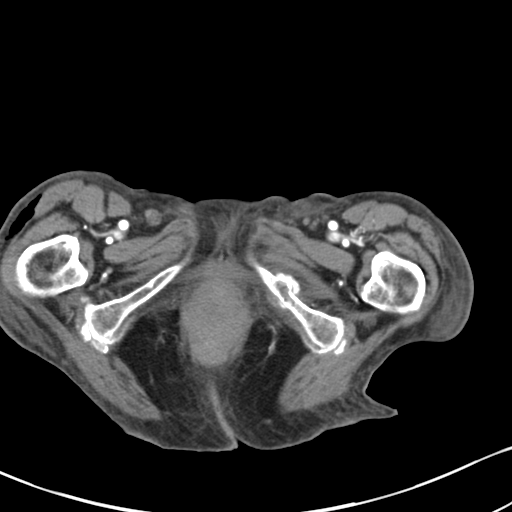
[im 4/64  bone]
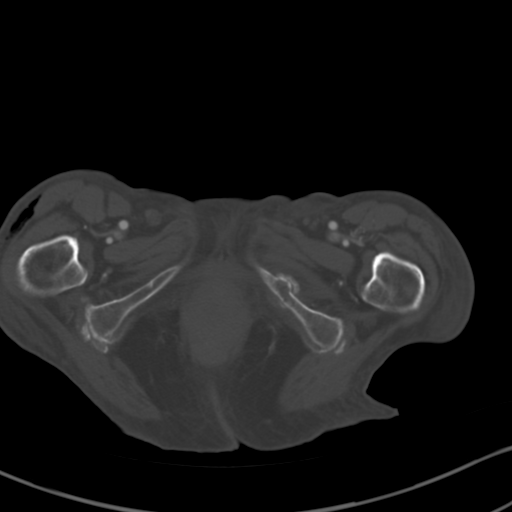
[im 11/64  soft-tissue]
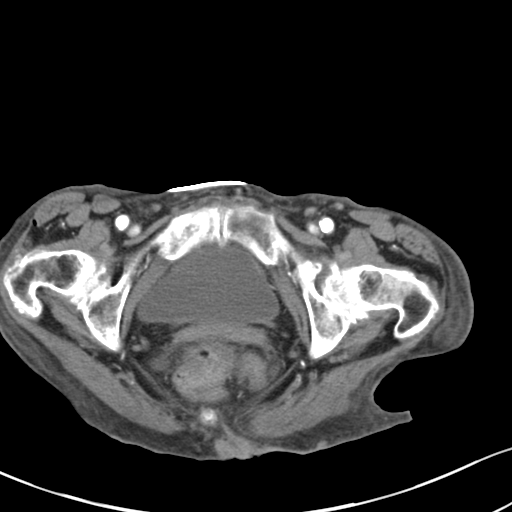
[im 15/64  soft-tissue]
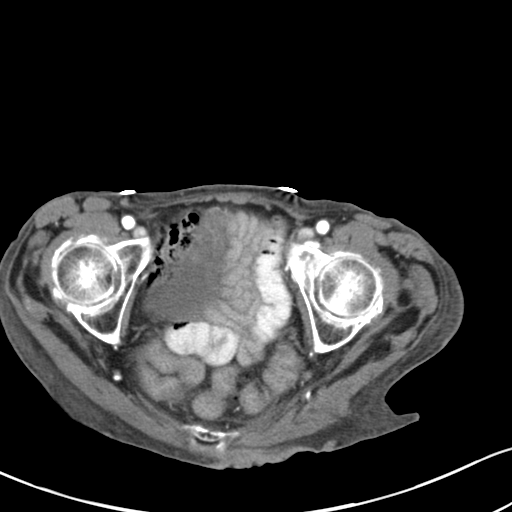
[im 18/64  soft-tissue]
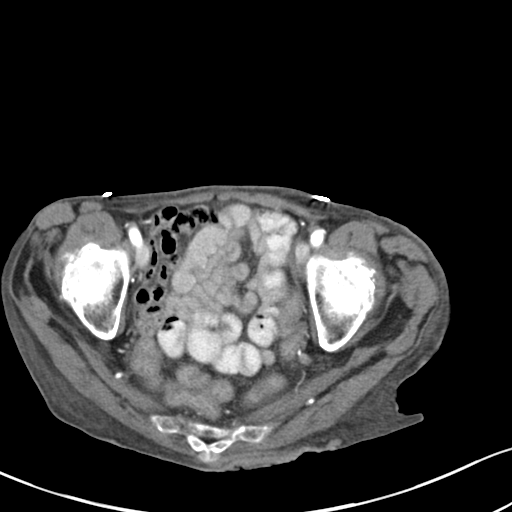
[im 25/64  soft-tissue]
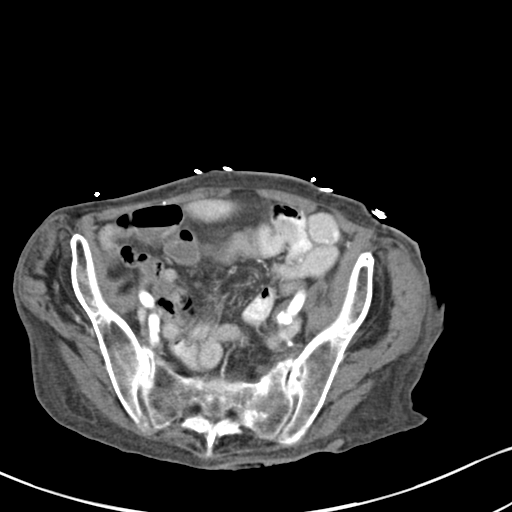
[im 29/64  soft-tissue]
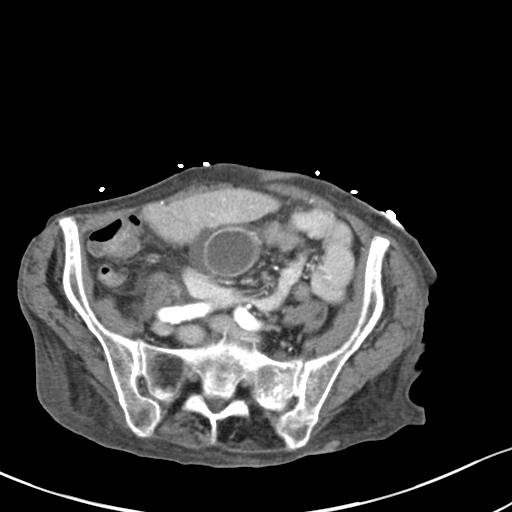
[im 36/64  soft-tissue]
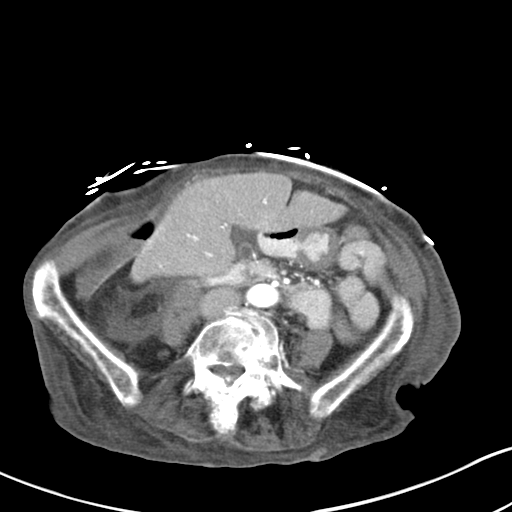
[im 39/64  soft-tissue]
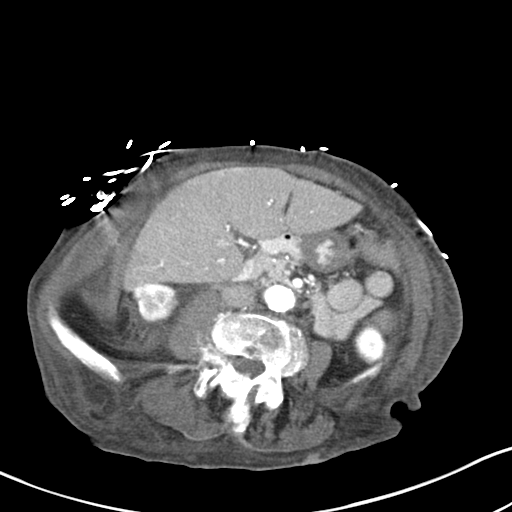
[im 46/64  soft-tissue]
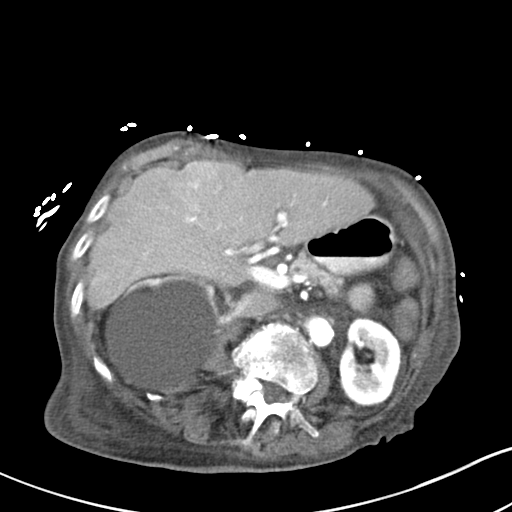
[im 46/64  bone]
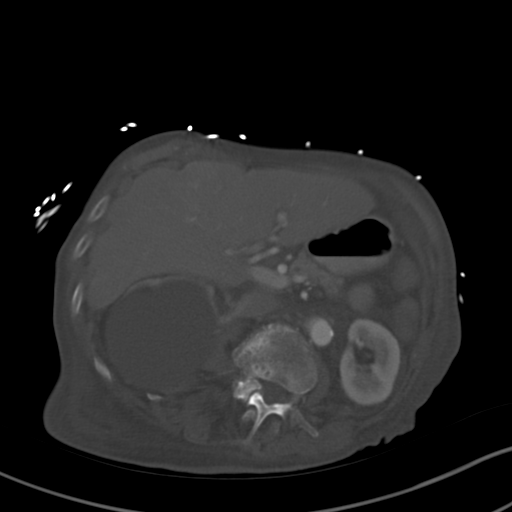
[im 50/64  soft-tissue]
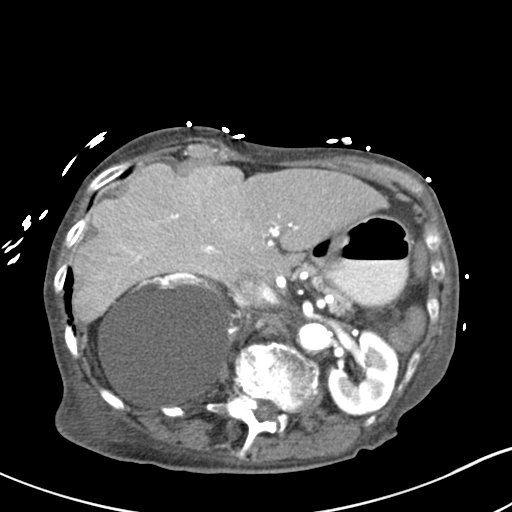
[im 50/64  lung]
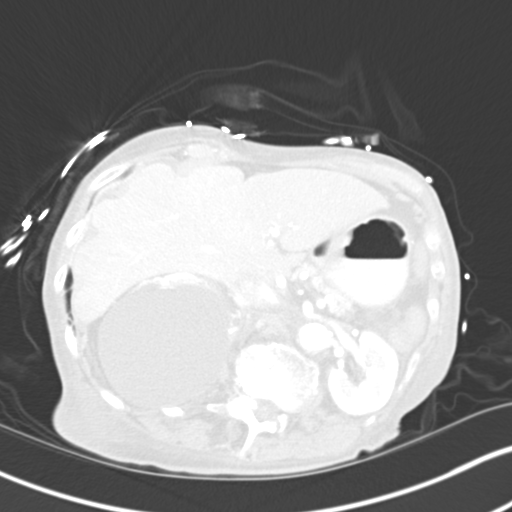
[im 53/64  soft-tissue]
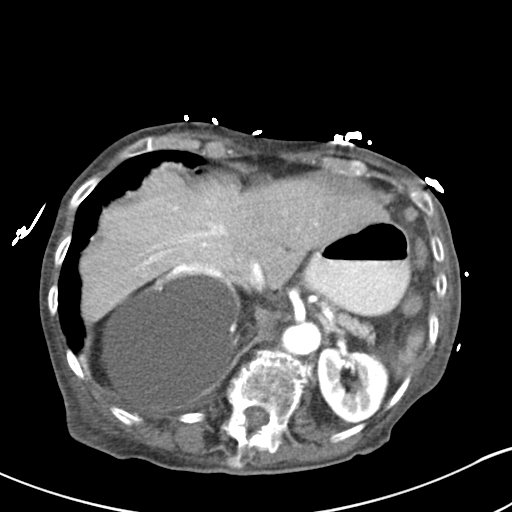
[im 53/64  lung]
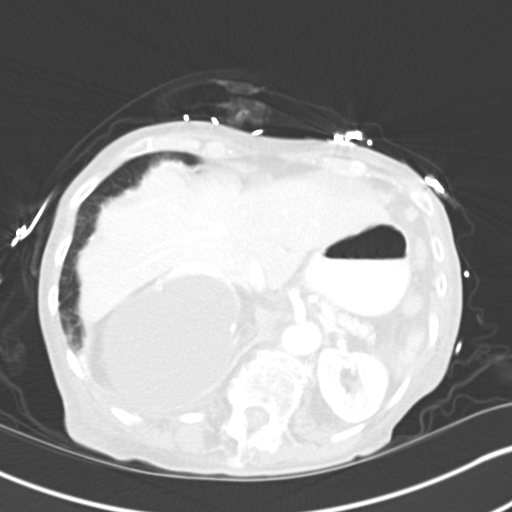
[im 57/64  lung]
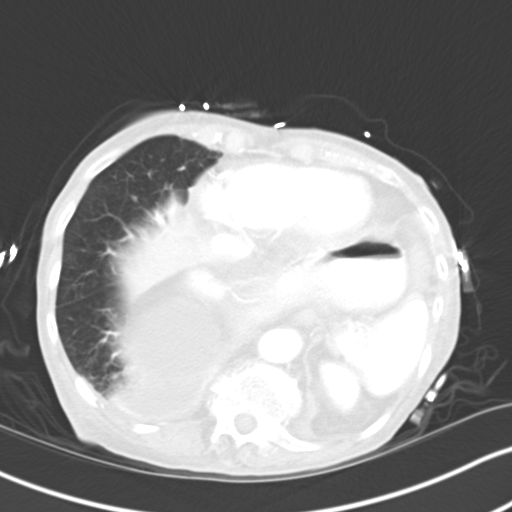
[im 60/64  soft-tissue]
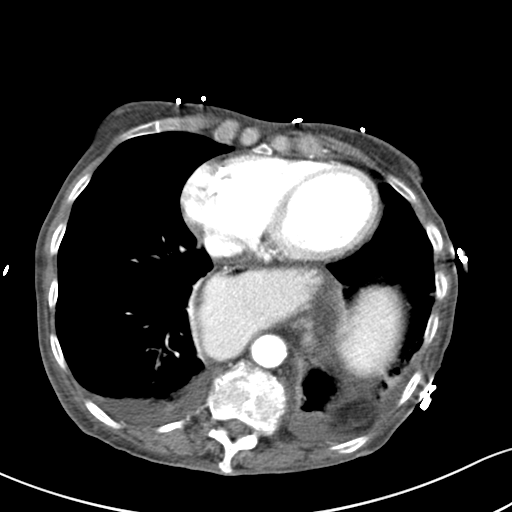
[im 60/64  lung]
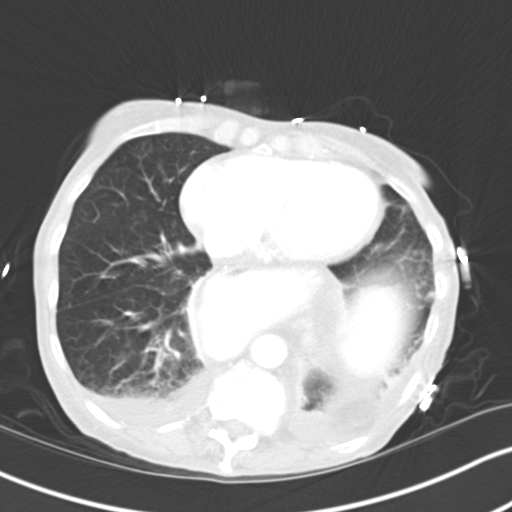

[13 of 32 positions shown; findings below may reference images not displayed]

FINDINGS: Lower chest: Minor basilar interstitial fibrosis pattern with
atelectasis and trace bilateral pleural effusions. Large hiatal
hernia noted. Normal heart size. No pericardial effusion.

Abdomen: Abdominal anatomy is slightly distorted secondary to the
degree of chronic levoscoliosis of the spine.

Gallbladder is in the midline of the abdomen with pericholecystic
fluid noted and mucosal enhancement. Small amount of free fluid
tracks along the right inferior liver margin and the right pericolic
gutter. Appearance is compatible with cholecystitis. No associated
biliary dilatation. Liver, biliary system, pancreas, spleen, adrenal
glands, and left kidney are within normal limits for age and
demonstrate no acute process.

Right kidney demonstrates a large upper pole renal cyst measuring
8.2 x 8.6 cm, image 14.

Atherosclerosis noted of the abdominal aorta without aneurysm. Aorta
is tortuous again related to the scoliosis.

Negative for bowel obstruction, dilatation, ileus, or free air.

Appendix not demonstrated.

Pelvis: Trace pelvic free fluid. Prior hysterectomy noted. No acute
distal bowel process. Urinary bladder unremarkable. No pelvic fluid
collection, hemorrhage, abscess, adenopathy, inguinal abnormality,
or hernia.

Healing fractures present of the left superior and inferior rami.
Degenerative changes of the hips and spine diffusely.

Imaged proximal right hip and thigh demonstrates subcutaneous
emphysema, of uncertain origin. This is partially imaged.
IMPRESSION: Abnormal gallbladder with mucosal enhancement and surrounding
pericholecystic fluid. Free fluid tracks along the posterior right
liver margin and right pericolic gutter. Findings are compatible
with cholecystitis.

No associated biliary dilatation or obstruction.

Small bilateral pleural effusions with basilar atelectasis/fibrosis

Large hiatal hernia

8.6 cm right upper pole renal cyst

Anterior right hip and proximal thigh subcutaneous air/ emphysema
without clear etiology.

Healing subacute left superior and inferior rami fractures.

These results will be called to the ordering clinician or
representative by the [HOSPITAL] at the imaging location.

## 2015-08-20 IMAGING — CR DG CHEST 2V
2 series · 2 of 2 positions shown · non-contrast
Comparison: 08/27/2014 and prior chest radiographs

CLINICAL DATA: Cough and solid food dysphagia.  Initial encounter.

EXAM:
CHEST  2 VIEW

[w chest lat]
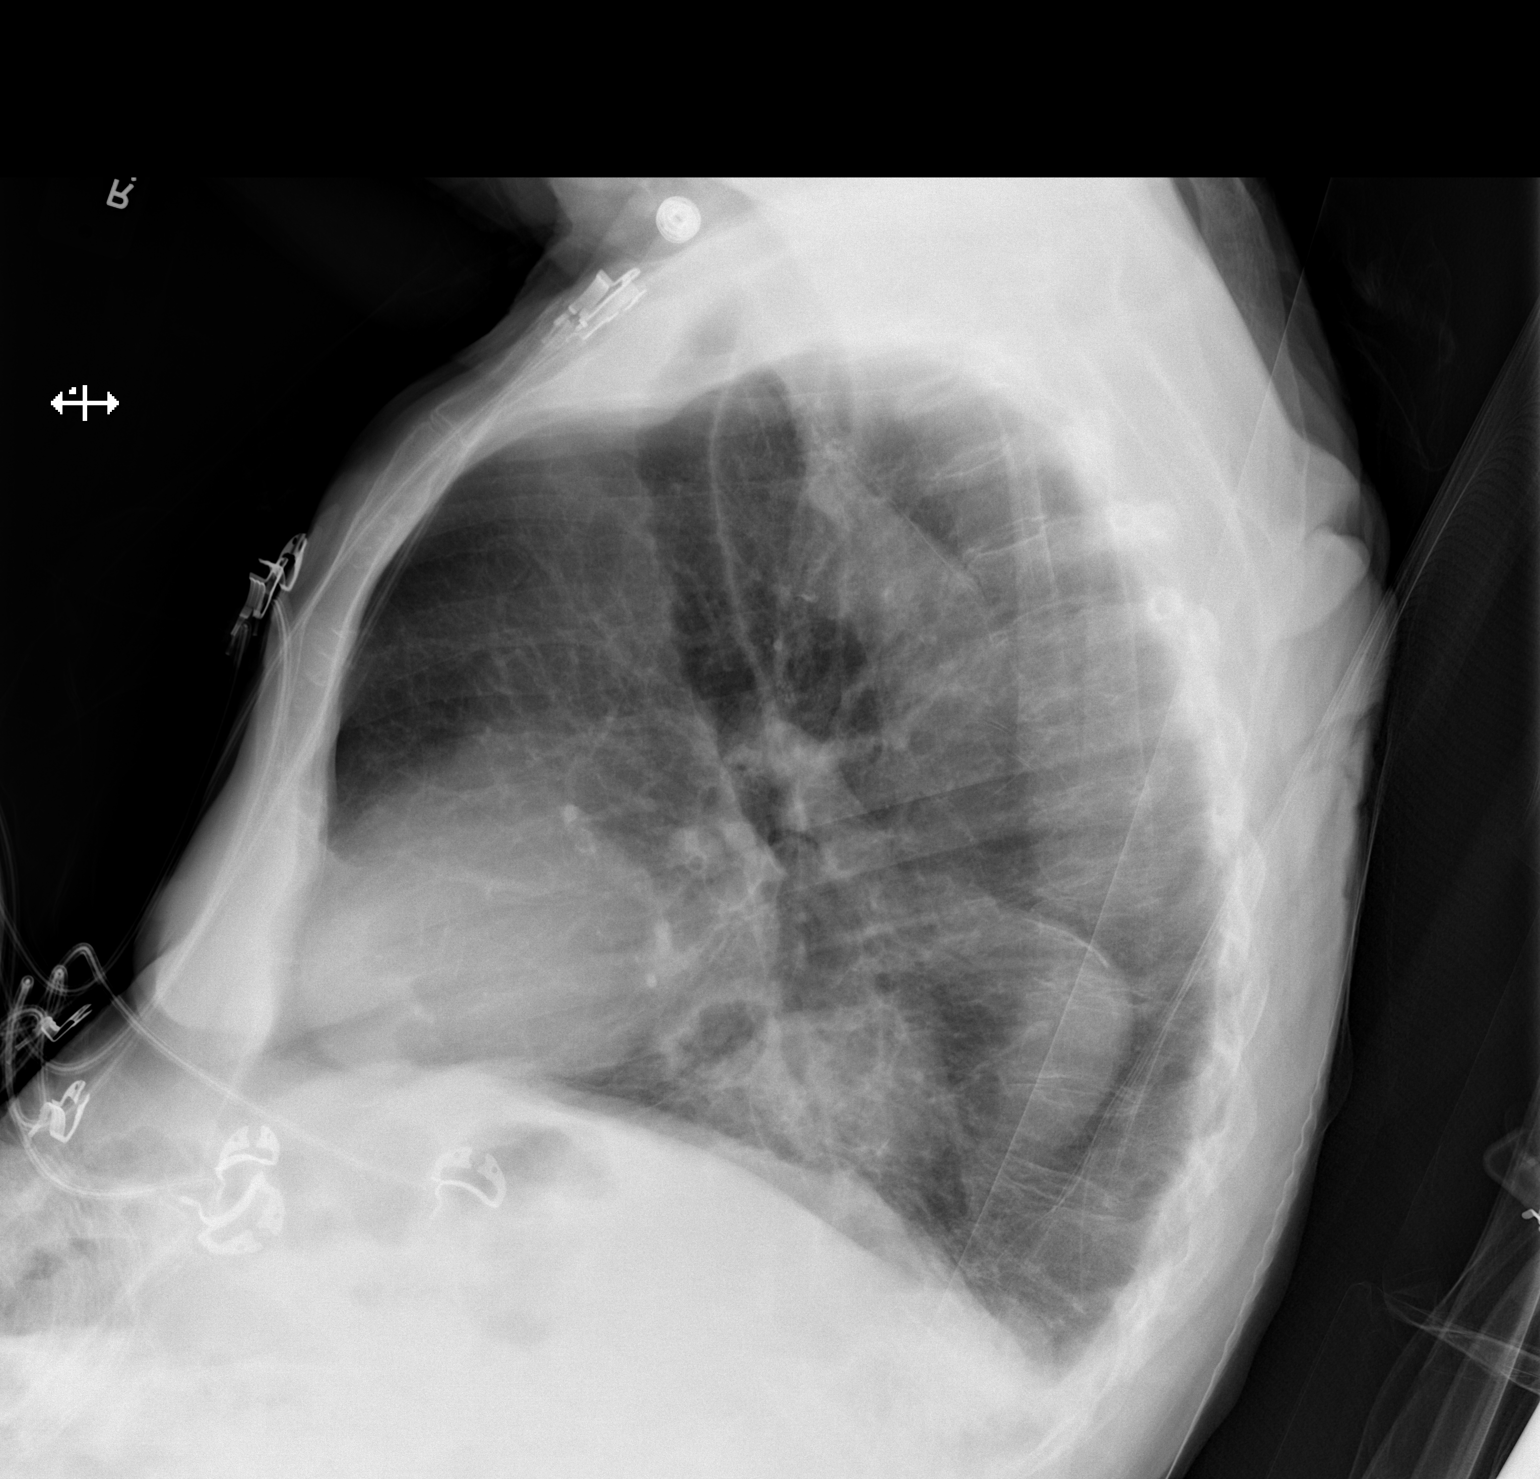

[x chest ap]
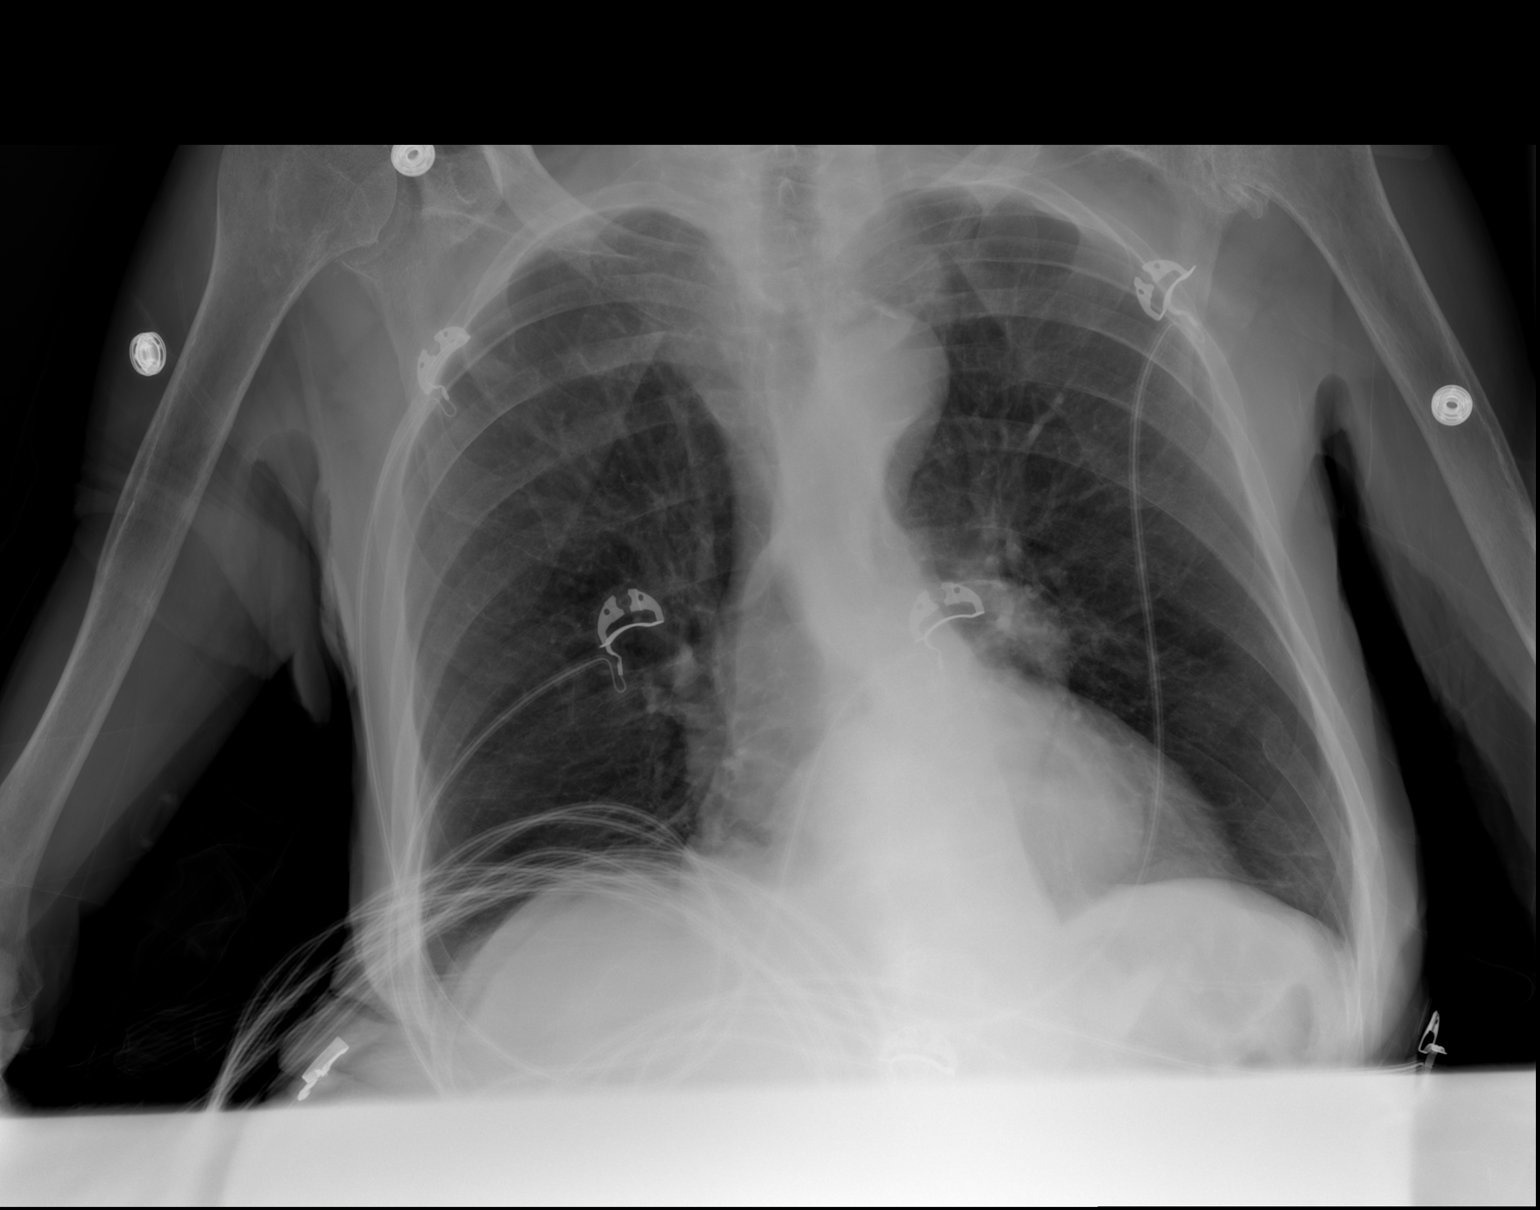

[2 of 2 positions shown; findings below may reference images not displayed]

FINDINGS: Cardiomegaly and large hiatal hernia again noted.

There is no evidence of focal airspace disease, pulmonary edema,
suspicious pulmonary nodule/mass, pleural effusion, or pneumothorax.
No acute bony abnormalities are identified.

Severe degenerative changes in the left shoulder again noted as well
as remote left rib fractures.
IMPRESSION: No evidence of acute cardiopulmonary disease.

Cardiomegaly and large hiatal hernia.

## 2016-01-13 DEATH — deceased
